# Patient Record
Sex: Female | Born: 1965
Health system: Southern US, Community
[De-identification: ages and names within clinical notes are randomized; demographics above are authoritative.]

## PROBLEM LIST (undated history)

## (undated) DIAGNOSIS — K219 Gastro-esophageal reflux disease without esophagitis: Secondary | ICD-10-CM

## (undated) DIAGNOSIS — F32A Depression, unspecified: Secondary | ICD-10-CM

## (undated) DIAGNOSIS — E669 Obesity, unspecified: Secondary | ICD-10-CM

## (undated) DIAGNOSIS — R1013 Epigastric pain: Secondary | ICD-10-CM

## (undated) DIAGNOSIS — E039 Hypothyroidism, unspecified: Secondary | ICD-10-CM

## (undated) DIAGNOSIS — O24419 Gestational diabetes mellitus in pregnancy, unspecified control: Secondary | ICD-10-CM

## (undated) DIAGNOSIS — M722 Plantar fascial fibromatosis: Secondary | ICD-10-CM

## (undated) DIAGNOSIS — E119 Type 2 diabetes mellitus without complications: Secondary | ICD-10-CM

## (undated) DIAGNOSIS — F419 Anxiety disorder, unspecified: Secondary | ICD-10-CM

## (undated) DIAGNOSIS — E785 Hyperlipidemia, unspecified: Secondary | ICD-10-CM

## (undated) DIAGNOSIS — F329 Major depressive disorder, single episode, unspecified: Secondary | ICD-10-CM

## (undated) DIAGNOSIS — J309 Allergic rhinitis, unspecified: Secondary | ICD-10-CM

## (undated) HISTORY — PX: TONSILLECTOMY AND ADENOIDECTOMY: SUR1326

## (undated) HISTORY — DX: Obesity, unspecified: E66.9

## (undated) HISTORY — DX: Allergic rhinitis, unspecified: J30.9

## (undated) HISTORY — DX: Major depressive disorder, single episode, unspecified: F32.9

## (undated) HISTORY — DX: Hyperlipidemia, unspecified: E78.5

## (undated) HISTORY — DX: Plantar fascial fibromatosis: M72.2

## (undated) HISTORY — DX: Anxiety disorder, unspecified: F41.9

## (undated) HISTORY — PX: OTHER SURGICAL HISTORY: SHX169

## (undated) HISTORY — DX: Hypothyroidism, unspecified: E03.9

## (undated) HISTORY — PX: TUBAL LIGATION: SHX77

## (undated) HISTORY — DX: Type 2 diabetes mellitus without complications: E11.9

## (undated) HISTORY — DX: Depression, unspecified: F32.A

## (undated) HISTORY — DX: Epigastric pain: R10.13

## (undated) HISTORY — DX: Gastro-esophageal reflux disease without esophagitis: K21.9

## (undated) HISTORY — DX: Gestational diabetes mellitus in pregnancy, unspecified control: O24.419

---

## 1999-07-23 ENCOUNTER — Other Ambulatory Visit: Admission: RE | Admit: 1999-07-23 | Discharge: 1999-07-23 | Payer: Self-pay | Admitting: Obstetrics and Gynecology

## 1999-09-10 ENCOUNTER — Encounter: Admission: RE | Admit: 1999-09-10 | Discharge: 1999-12-09 | Payer: Self-pay | Admitting: *Deleted

## 2000-08-04 ENCOUNTER — Other Ambulatory Visit: Admission: RE | Admit: 2000-08-04 | Discharge: 2000-08-04 | Payer: Self-pay | Admitting: Obstetrics and Gynecology

## 2000-09-07 ENCOUNTER — Encounter (INDEPENDENT_AMBULATORY_CARE_PROVIDER_SITE_OTHER): Payer: Self-pay | Admitting: Specialist

## 2000-09-07 ENCOUNTER — Other Ambulatory Visit: Admission: RE | Admit: 2000-09-07 | Discharge: 2000-09-07 | Payer: Self-pay | Admitting: Physical Therapy

## 2000-12-14 ENCOUNTER — Other Ambulatory Visit: Admission: RE | Admit: 2000-12-14 | Discharge: 2000-12-14 | Payer: Self-pay | Admitting: Obstetrics and Gynecology

## 2001-08-17 ENCOUNTER — Other Ambulatory Visit: Admission: RE | Admit: 2001-08-17 | Discharge: 2001-08-17 | Payer: Self-pay | Admitting: Obstetrics and Gynecology

## 2002-02-01 ENCOUNTER — Other Ambulatory Visit: Admission: RE | Admit: 2002-02-01 | Discharge: 2002-02-01 | Payer: Self-pay | Admitting: Obstetrics and Gynecology

## 2002-03-03 ENCOUNTER — Encounter (INDEPENDENT_AMBULATORY_CARE_PROVIDER_SITE_OTHER): Payer: Self-pay | Admitting: *Deleted

## 2002-03-03 ENCOUNTER — Ambulatory Visit (HOSPITAL_COMMUNITY): Admission: RE | Admit: 2002-03-03 | Discharge: 2002-03-03 | Payer: Self-pay | Admitting: Obstetrics and Gynecology

## 2002-09-26 ENCOUNTER — Other Ambulatory Visit: Admission: RE | Admit: 2002-09-26 | Discharge: 2002-09-26 | Payer: Self-pay | Admitting: Obstetrics and Gynecology

## 2003-03-27 ENCOUNTER — Other Ambulatory Visit: Admission: RE | Admit: 2003-03-27 | Discharge: 2003-03-27 | Payer: Self-pay | Admitting: Obstetrics and Gynecology

## 2004-04-23 ENCOUNTER — Other Ambulatory Visit: Admission: RE | Admit: 2004-04-23 | Discharge: 2004-04-23 | Payer: Self-pay | Admitting: Obstetrics and Gynecology

## 2004-11-18 ENCOUNTER — Encounter: Admission: RE | Admit: 2004-11-18 | Discharge: 2005-02-16 | Payer: Self-pay | Admitting: Internal Medicine

## 2005-04-27 ENCOUNTER — Other Ambulatory Visit: Admission: RE | Admit: 2005-04-27 | Discharge: 2005-04-27 | Payer: Self-pay | Admitting: Physical Therapy

## 2005-05-01 ENCOUNTER — Emergency Department (HOSPITAL_COMMUNITY): Admission: EM | Admit: 2005-05-01 | Discharge: 2005-05-01 | Payer: Self-pay | Admitting: Family Medicine
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
## 2007-05-06 ENCOUNTER — Ambulatory Visit (HOSPITAL_COMMUNITY): Admission: RE | Admit: 2007-05-06 | Discharge: 2007-05-06 | Payer: Self-pay | Admitting: Obstetrics and Gynecology

## 2008-07-25 ENCOUNTER — Encounter: Admission: RE | Admit: 2008-07-25 | Discharge: 2008-07-25 | Payer: Self-pay | Admitting: Obstetrics and Gynecology

## 2009-03-12 ENCOUNTER — Encounter: Payer: Self-pay | Admitting: Cardiovascular Disease

## 2009-03-27 ENCOUNTER — Encounter: Payer: Self-pay | Admitting: Cardiovascular Disease

## 2009-03-27 DIAGNOSIS — R079 Chest pain, unspecified: Secondary | ICD-10-CM

## 2009-03-28 ENCOUNTER — Ambulatory Visit: Payer: Self-pay | Admitting: Cardiovascular Disease

## 2009-03-28 DIAGNOSIS — J309 Allergic rhinitis, unspecified: Secondary | ICD-10-CM | POA: Insufficient documentation

## 2009-03-28 DIAGNOSIS — F329 Major depressive disorder, single episode, unspecified: Secondary | ICD-10-CM

## 2009-03-28 DIAGNOSIS — E785 Hyperlipidemia, unspecified: Secondary | ICD-10-CM

## 2009-03-28 DIAGNOSIS — K219 Gastro-esophageal reflux disease without esophagitis: Secondary | ICD-10-CM

## 2009-03-28 DIAGNOSIS — E119 Type 2 diabetes mellitus without complications: Secondary | ICD-10-CM

## 2009-03-28 DIAGNOSIS — E039 Hypothyroidism, unspecified: Secondary | ICD-10-CM | POA: Insufficient documentation

## 2009-04-09 ENCOUNTER — Telehealth (INDEPENDENT_AMBULATORY_CARE_PROVIDER_SITE_OTHER): Payer: Self-pay | Admitting: *Deleted

## 2009-04-10 ENCOUNTER — Encounter (HOSPITAL_COMMUNITY): Admission: RE | Admit: 2009-04-10 | Discharge: 2009-05-23 | Payer: Self-pay | Admitting: Cardiovascular Disease

## 2009-04-10 ENCOUNTER — Ambulatory Visit: Payer: Self-pay | Admitting: Cardiology

## 2009-04-10 ENCOUNTER — Ambulatory Visit: Payer: Self-pay

## 2010-06-15 ENCOUNTER — Encounter: Payer: Self-pay | Admitting: Obstetrics and Gynecology

## 2010-06-16 IMAGING — MG MM DIAGNOSTIC UNILATERAL R
1 series · 1 of 1 positions shown · non-contrast
Comparison: none

CLINICAL DATA: The patient reported crusty material on her right
nipple and in her bra intermittently for 6 months.

DIGITAL DIAGNOSTIC RIGHT MAMMOGRAM

[R CC]
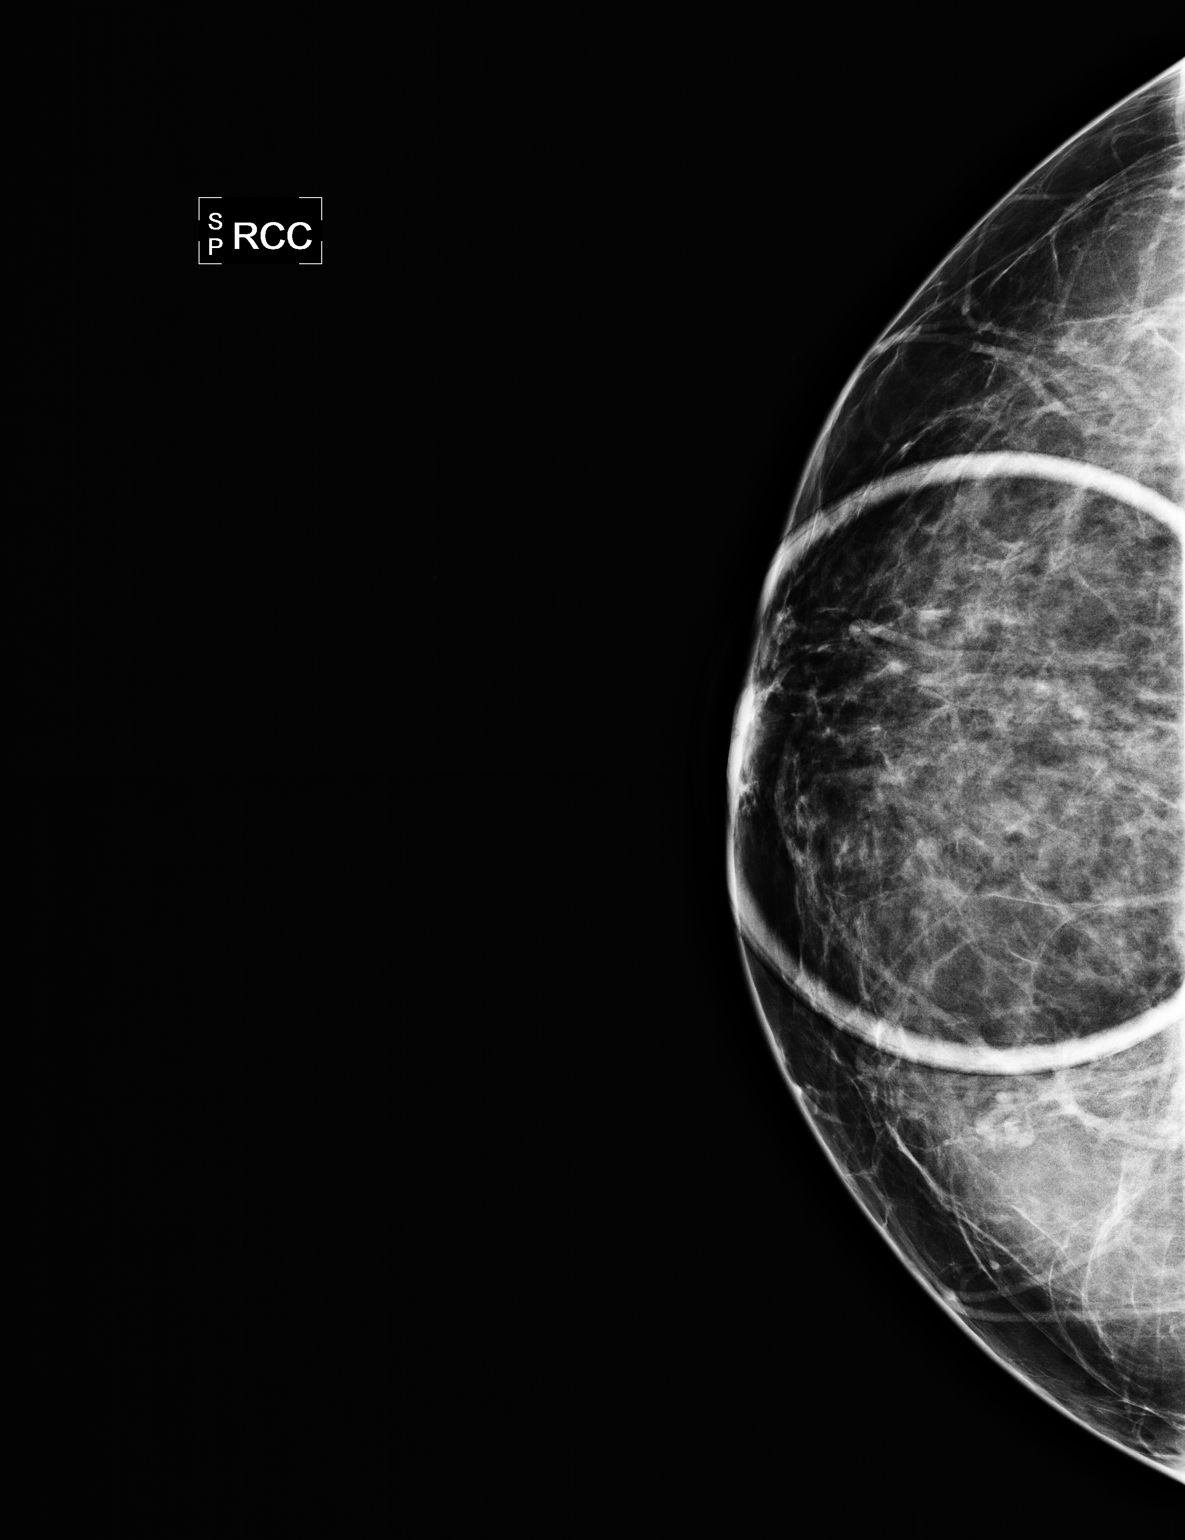

[1 of 1 positions shown; findings below may reference images not displayed]

FINDINGS: A spot tangential view of the right subareolar  region
demonstrates no mass, distortion or calcification to suggest
malignancy.

On physical examination, no discharge is elicited from the right
nipple.  The patient was counseled that a discharge of concern is
one that is unilateral, spontaneous, from a single duct, a
significant amount and clear, serous or bright red.
IMPRESSION: No mammographic evidence of malignancy.  There is no worrisome type
discharge present today.  Yearly screening mammography is
suggested.

BI-RADS CATEGORY 1:  Negative.

## 2010-10-07 NOTE — Op Note (Signed)
NAME:  MONAYE, BLACKIE              ACCOUNT NO.:  [id number]   MEDICAL RECORD NO.:  [id number]          PATIENT TYPE:  AMB   LOCATION:  [facility]                           FACILITY:  [facility]   PHYSICIAN:  Dineen Kid. Rana Snare, M.D.    DATE OF BIRTH:  1965-09-17   DATE OF PROCEDURE:  05/06/2007  DATE OF DISCHARGE:                               OPERATIVE REPORT   PREOPERATIVE DIAGNOSIS:  Multiparity, desires sterility and intrauterine  device.   POSTOPERATIVE DIAGNOSIS:  Multiparity, desires sterility and  intrauterine device.   PROCEDURES:  Laparoscopic bilateral tubal ligation by fulguration and  Mirena IUD  removal.   SURGEON:  Dr. Candice Camp.   ANESTHESIA:  General endotracheal.   INDICATIONS:  Ms. Hamada is a 45 year old, multigravida currently with an  IUD which has exhausted the at 5-year limit. She desires removal of the  IUD and permanent sterility.  Planned laparoscopic bilateral tubal  ligation.  The risks and benefits of the procedure were discussed at  length.  Informed consent was obtained. The risks include but not  limited to risk of infection, bleeding, damage to the uterus, tubes,  ovary, bowel, bladder, risks of tubal failure quoted at 09/998. She  gives her informed consent and wishes to proceed.   FINDINGS AT SURGERY:  A normal-appearing liver, normal-appearing uterus  and bilateral fallopian tubes and ovaries.   DESCRIPTION OF PROCEDURE:  After adequate analgesia, the patient was  placed in the dorsal lithotomy position.  She is sterilely prepped and  draped, the bladder is sterilely drained.  A Graves speculum is placed,  tenaculum is placed in the anterior lip of the cervix.  The IUD string  was easily grasped, delivered through the cervix, appeared to be normal.  The  IUD was then discarded. A Cohen tenaculum was placed on the cervix.  A 1-cm infraumbilical skin incision was made, a Veress needle was  inserted.  The abdomen was insufflated with dullness to percussion.  An  11-mm trocar was inserted.  The above findings were noted by  laparoscope. The right fallopian tube was identified by the fimbriated  end, mid portion of the fallopian tube grasped with bipolar cautery.  It  was cauterized over the mid portion of the fallopian tube approximately  a 3-cm section but good thermal burn noted and loss resistance by the  ohmmeter. The left fallopian tube was identified by the fimbriated end,  mid portion of fallopian tube grasped.  Cauterization with bipolar  cautery over a 3 cm section of fallopian tube with good thermal burn  noted and loss of resistance by the ohmmeter.  After reexamination of  the abdomen and pelvis, no pathology was noted.  The abdomen was then  desufflated, trocars removed.  The infraumbilical skin incision was  closed with a #0 Vicryl interrupted suture in the fascia, 3-0 Rapide  Vicryl in a subcuticular fashion.  The incision was injected with 0.25%  Marcaine, a total of 10 mL used.  The tenaculum was removed from the  cervix noted to be hemostatic.  The patient was then transferred to the  recovery room  in stable condition.  Sponge and instrument count was  normal x3.  Estimated blood loss was minimal.  The patient received 1  gram of cefotetan preoperatively and 30 mg of Toradol postoperatively.   DISPOSITION:  The patient will be discharged home and will follow-up in  the office in 2-3 weeks. Sent home with an instruction sheet for a  laparoscopic tubal. Told to returned for increased pain, fever or  bleeding. She also has a prescription for Vicodin #20.      Dineen Kid Rana Snare, M.D.  Electronically Signed     DCL/MEDQ  D:  05/06/2007  T:  05/06/2007  Job:  [id number]

## 2010-10-10 NOTE — H&P (Signed)
   NAME:  Doris Schmidt, Doris Schmidt                        ACCOUNT NO.:  [id number]   MEDICAL RECORD NO.:  [id number]                   PATIENT TYPE:  AMB   LOCATION:  [facility]                                  [facility]ILITY:  [facility]   PHYSICIAN:  Dineen Kid. Rana Snare, M.D.                 DATE OF BIRTH:  March 17, 1966   DATE OF ADMISSION:  03/03/2002  DATE OF DISCHARGE:                                HISTORY & PHYSICAL   HISTORY OF PRESENT ILLNESS:  The patient is a 45 year old, G2, P2 with  abnormal uterine bleeding with breakthrough bleeding despite being on oral  contraceptives agents.  She underwent a sonohistogram which shows a small  endometrial echogenic area consistent with a polyp which measured 3.8 mm in  size.  She also desires IUD and has a history of dysmenorrhea and is  currently on Ortho-Novum 135.  She presents for hysteroscopy, D&C and IUD  insertion.   PAST MEDICAL HISTORY:  Negative.   PAST SURGICAL HISTORY:  Tonsillectomy at age 5.   MEDICATIONS:  Ortho-Novum birth control pills.   ALLERGIES:  SULFA.  SHELLFISH.   PHYSICAL EXAMINATION:  VITAL SIGNS:  Blood pressure 92/58.  HEART:  Regular rate and rhythm.  LUNGS:  Clear to auscultation bilaterally.  ABDOMEN:  Nondistended, nontender.  PELVIC:  Uterus is anteverted, mobile and nontender.   LABORATORY DATA AND X-RAY FINDINGS:  Ultrasound shows a 3.8 mm calcified  endometrial mass in the posterior endometrium.   IMPRESSION:  1. Abnormal uterine bleeding the responds to conservative medical     management.  2. Endometrial mass on the histogram.   PLAN:  1. Hysteroscopy and D&C with treatment of evaluation of this.  The risks and     benefits were discussed and informed consent was obtained.  2. Regarding the IUD, she is set on this and the risks and benefits were     discussed at length and she signed an informed consent for the IUD.                                               Dineen Kid Rana Snare, M.D.    DCL/MEDQ  D:  03/03/2002  T:   03/03/2002  Job:  [id number]

## 2010-10-10 NOTE — Op Note (Signed)
NAME:  Doris Schmidt, Doris Schmidt                        ACCOUNT NO.:  [id number]   MEDICAL RECORD NO.:  [id number]                   PATIENT TYPE:  AMB   LOCATION:  [facility]                                  [facility]   PHYSICIAN:  Dineen Kid. Rana Snare, M.D.                 DATE OF BIRTH:  [birth year]   DATE OF PROCEDURE:  03/03/2002  DATE OF DISCHARGE:                                 OPERATIVE REPORT   PREOPERATIVE DIAGNOSES:  1. Abnormal uterine bleeding.  2. Endometrial masses on hysterogram.  3. Desires Marina intrauterine device.   POSTOPERATIVE DIAGNOSES:  1. Abnormal uterine bleeding.  2. Endometrial masses on hysterogram.  3. Desires Marina intrauterine device.  4. Endometrial polyp.   PROCEDURE:  Hysteroscopy D & C with polypectomy and IUD insertion.   SURGEON:  Dineen Kid. Rana Snare, M.D.   ANESTHESIA:  Monitored anesthetic care and paracervical block.   INDICATIONS FOR PROCEDURE:  This patient is a 45 year old G2, P2, with  abnormal uterine bleeding on birth control pills.  She underwent a  sonohysterogram which showed an endometrial mass, consistent with  endometrial polyps.  She presents for hysteroscopy D & C.  Also desires  Jearld Adjutant IUD insertion.  The risks and benefits were discussed at length.  An  informed consent was obtained.  Findings at the time of surgery were a small  thickening on the posterior endometrium, consistent with an early  endometrial polyp; otherwise, normal-appearing endometrium, ostium, and  cervix.   DESCRIPTION OF PROCEDURE:  After adequate analgesia, the patient was placed  in the dorsal lithotomy position.  She was sterilely prepped and draped.  The bladder was thoroughly drained.  A Graves speculum was placed.  A  tenaculum was placed on the anterior lip of the cervix.  A paracervical  block was placed with 1% Xylocaine with 1:100,000 epinephrine, 20 cc used.  The uterus was sounded to 8 cm and easily dilated to a #27 Pratt dilator.  The hysteroscope was  inserted with the above findings noted.  A polypectomy  was carried out with polyp forceps.  It was followed by gentle sharp  curettage, retrieving small fragments of endometrium.  Re-examination with  the hysteroscope revealed normal-appearing endometrium with no residual  endometrial polyp.  At this point, the [facility] IUD was inserted according to  the [employer] specifications. The string was cut to 0.5 cm.  The  tenaculum was removed from the anterior lip of the cervix.  Hemostasis was  achieved with direct pressure, and then the speculum was then removed.  The  patient tolerated the procedure well and was stable and transferred to the  recovery room.  The patient received 1 g of Cefotetan preoperatively and  Toradol 30 mg intraoperatively.    DISPOSITION:  The patient will be discharged home with followup in the  office in two to three weeks.  She was sent home with a routine instruction  sheet for D & C.                                               Dineen Kid Rana Snare, M.D.    DCL/MEDQ  D:  03/03/2002  T:  03/04/2002  Job:  [id number]

## 2011-03-02 LAB — COMPREHENSIVE METABOLIC PANEL
ALT: 20
AST: 17
Albumin: 4
CO2: 28
Calcium: 9.2
GFR calc Af Amer: >60
Sodium: 139
Total Protein: 6.8

## 2011-03-02 LAB — CBC
MCHC: 34.9
Platelets: 283
RBC: 4.45
RDW: 12.2

## 2011-03-02 LAB — URINALYSIS, ROUTINE W REFLEX MICROSCOPIC
Glucose, UA: NEGATIVE
Leukocytes, UA: NEGATIVE
Protein, ur: NEGATIVE
Urobilinogen, UA: 0.2
pH: 7

## 2011-03-02 LAB — URINE MICROSCOPIC-ADD ON

## 2012-03-24 ENCOUNTER — Other Ambulatory Visit: Payer: Self-pay | Admitting: Obstetrics and Gynecology

## 2014-07-23 ENCOUNTER — Other Ambulatory Visit: Payer: Self-pay | Admitting: Obstetrics and Gynecology

## 2014-07-24 LAB — CYTOLOGY - PAP

## 2016-06-02 DIAGNOSIS — E1165 Type 2 diabetes mellitus with hyperglycemia: Secondary | ICD-10-CM | POA: Diagnosis not present

## 2016-06-02 DIAGNOSIS — E038 Other specified hypothyroidism: Secondary | ICD-10-CM | POA: Diagnosis not present

## 2016-06-03 ENCOUNTER — Other Ambulatory Visit (HOSPITAL_COMMUNITY): Payer: Self-pay | Admitting: Emergency Medicine

## 2016-06-03 DIAGNOSIS — R52 Pain, unspecified: Secondary | ICD-10-CM

## 2016-06-08 DIAGNOSIS — E1165 Type 2 diabetes mellitus with hyperglycemia: Secondary | ICD-10-CM | POA: Diagnosis not present

## 2016-06-08 DIAGNOSIS — Z0000 Encounter for general adult medical examination without abnormal findings: Secondary | ICD-10-CM | POA: Diagnosis not present

## 2016-06-08 DIAGNOSIS — Z1389 Encounter for screening for other disorder: Secondary | ICD-10-CM | POA: Diagnosis not present

## 2016-06-12 ENCOUNTER — Ambulatory Visit (HOSPITAL_COMMUNITY)
Admission: RE | Admit: 2016-06-12 | Discharge: 2016-06-12 | Disposition: A | Discharge status: Home / Self Care | Payer: 59 | Source: Ambulatory Visit | Attending: Emergency Medicine | Admitting: Emergency Medicine

## 2016-06-12 DIAGNOSIS — R52 Pain, unspecified: Secondary | ICD-10-CM

## 2016-09-04 ENCOUNTER — Other Ambulatory Visit (HOSPITAL_COMMUNITY): Payer: Self-pay

## 2016-09-04 DIAGNOSIS — R1031 Right lower quadrant pain: Secondary | ICD-10-CM

## 2016-09-04 DIAGNOSIS — R1032 Left lower quadrant pain: Principal | ICD-10-CM

## 2016-09-08 ENCOUNTER — Ambulatory Visit (HOSPITAL_COMMUNITY)
Admission: RE | Admit: 2016-09-08 | Discharge: 2016-09-08 | Disposition: A | Discharge status: Home / Self Care | Payer: Self-pay | Source: Ambulatory Visit

## 2016-09-08 DIAGNOSIS — R1031 Right lower quadrant pain: Secondary | ICD-10-CM | POA: Insufficient documentation

## 2016-09-08 DIAGNOSIS — R1032 Left lower quadrant pain: Secondary | ICD-10-CM | POA: Insufficient documentation

## 2016-09-14 DIAGNOSIS — E1165 Type 2 diabetes mellitus with hyperglycemia: Secondary | ICD-10-CM | POA: Diagnosis not present

## 2016-09-16 DIAGNOSIS — Z01419 Encounter for gynecological examination (general) (routine) without abnormal findings: Secondary | ICD-10-CM | POA: Diagnosis not present

## 2016-12-08 DIAGNOSIS — E1165 Type 2 diabetes mellitus with hyperglycemia: Secondary | ICD-10-CM | POA: Diagnosis not present

## 2016-12-08 DIAGNOSIS — E038 Other specified hypothyroidism: Secondary | ICD-10-CM | POA: Diagnosis not present

## 2017-01-07 ENCOUNTER — Ambulatory Visit (INDEPENDENT_AMBULATORY_CARE_PROVIDER_SITE_OTHER): Payer: 59 | Admitting: Neurology

## 2017-01-07 ENCOUNTER — Encounter: Payer: Self-pay | Admitting: Neurology

## 2017-01-07 VITALS — BP 125/78 | HR 72 | Ht 66.0 in | Wt 251.0 lb

## 2017-01-07 DIAGNOSIS — R51 Headache: Secondary | ICD-10-CM

## 2017-01-07 DIAGNOSIS — R0683 Snoring: Secondary | ICD-10-CM

## 2017-01-07 DIAGNOSIS — G4761 Periodic limb movement disorder: Secondary | ICD-10-CM | POA: Diagnosis not present

## 2017-01-07 DIAGNOSIS — Z6841 Body Mass Index (BMI) 40.0 and over, adult: Secondary | ICD-10-CM | POA: Diagnosis not present

## 2017-01-07 DIAGNOSIS — G4719 Other hypersomnia: Secondary | ICD-10-CM | POA: Diagnosis not present

## 2017-01-07 DIAGNOSIS — G2581 Restless legs syndrome: Secondary | ICD-10-CM | POA: Diagnosis not present

## 2017-01-07 DIAGNOSIS — R519 Headache, unspecified: Secondary | ICD-10-CM

## 2017-01-07 NOTE — Progress Notes (Signed)
Subjective:    Patient ID: Doris Schmidt is a 51 y.o. female.  HPI     Doris Foley, MD, PhD [facility] [address] [address] [place], [place] [address]  Dear Dr. Timothy Schmidt,   I saw your patient, Doris Schmidt, upon your kind request in my neurologic clinic today for initial consultation of her sleep disorder, in particular, concern for underlying obstructive sleep apnea. The patient is unaccompanied today. As you know, Doris Schmidt is a 51 year old right-handed woman with an underlying medical history of type 2 diabetes, hypothyroidism, reflux disease, depression, hyperlipidemia, allergic rhinitis, plantar fasciitis,  remote history of smoking and morbid obesity with a BMI of over 40, who reports snoring and excessive daytime somnolence. I reviewed your office note from 12/08/2016, which kindly included. She had a sleep study many years ago which was reportedly negative for sleep apnea. Prior sleep study results are not available for my review today.  She believes her sleep study was about 10 years ago or so. Her snoring has become worse in the past few years, in the past several months her daytime somnolence has become worse. Her Epworth sleepiness score is 14 or 15 out of 24, fatigue score is 35 out of 63. She does not wake up rested, she has multiple nighttime awakenings. She does not always allow for 7 or 8 hours of sleep. Bedtime is around 11 PM, wakeup time around 6. She does have a somewhat flexible work schedule at this time. She is also transitioning into a new overall at her workplace. She has 2 children, ages 35 and 40. She quit smoking in 1998, drinks alcohol infrequently, about twice a month or so, one large cup of coffee per day typically and no day-to-day sodas or tea. She had more stress in the recent past. Last August her father was diagnosed with leukemia. He is doing better thankfully. Her weight has remained fairly stable, she did have some weight  loss but gained it back for the most part. She endorses restless leg symptoms and has been told by her husband that she moves a lot in her sleep including rubbing her feet. She does not typically have night to night nocturia but does have occasional morning headaches. She is not aware of any family history of OSA but does remember that her father snores loudly.  Her Past Medical History Is Significant For: Past Medical History:  Diagnosis Date  . Allergic rhinitis   . Anxiety   . Depression   . Diabetes mellitus without complication (HCC)   . Dyspepsia   . GERD (gastroesophageal reflux disease)   . Gestational diabetes   . Hyperlipidemia   . Hypothyroidism   . Obesity   . Plantar fasciitis of right foot     Her Past Surgical History Is Significant For: Past Surgical History:  Procedure Laterality Date  . TONSILLECTOMY AND ADENOIDECTOMY    . TUBAL LIGATION    . tubes in ears      Her Family History Is Significant For: Family History  Problem Relation Age of Onset  . Hypertension Mother   . Hyperlipidemia Mother   . Diabetes Mellitus II Father   . CAD Father   . Leukemia Father     Her Social History Is Significant For: Social History   Social History  . Marital status: Married    Spouse name: N/A  . Number of children: N/A  . Years of education: N/A   Social History Main  Topics  . Smoking status: Former Smoker    Quit date: 05/25/1996  . Smokeless tobacco: Never Used  . Alcohol use Yes  . Drug use: No  . Sexual activity: Not Asked   Other Topics Concern  . None   Social History Narrative  . None    Her Allergies Are:  No Known Allergies:   Her Current Medications Are:  Outpatient Encounter Prescriptions as of 01/07/2017  Medication Sig  . aspirin EC 81 MG tablet Take 81 mg by mouth daily.  Marland Kitchen buPROPion (WELLBUTRIN XL) 300 MG 24 hr tablet Take 300 mg by mouth daily.  . citalopram (CELEXA) 20 MG tablet Take 20 mg by mouth daily.  . fluticasone (FLONASE)  50 MCG/ACT nasal spray Place 2 sprays into both nostrils daily.  Marland Kitchen glimepiride (AMARYL) 2 MG tablet Take 2 mg by mouth every evening.  Marland Kitchen glimepiride (AMARYL) 4 MG tablet Take 4 mg by mouth daily with breakfast.   . levothyroxine (SYNTHROID, LEVOTHROID) 125 MCG tablet Take 125 mcg by mouth daily before breakfast.  . loratadine (CLARITIN) 10 MG tablet Take 10 mg by mouth daily.  . metFORMIN (GLUCOPHAGE) 1000 MG tablet Take 1,000 mg by mouth 2 (two) times daily with a meal.   No facility-administered encounter medications on file as of 01/07/2017.   :  Review of Systems:  Out of a complete 14 point review of systems, all are reviewed and negative with the exception of these symptoms as listed below: Review of Systems  Neurological:       Pt presents today to discuss her sleep. Pt has had a sleep study 10 years ago and it was normal. Pt does snore.  Epworth Sleepiness Scale 0= would never doze 1= slight chance of dozing 2= moderate chance of dozing 3= high chance of dozing  Sitting and reading: 3 Watching TV: 3 Sitting inactive in a public place (ex. Theater or meeting): 1 As a passenger in a car for an hour without a break: 3 Lying down to rest in the afternoon: 1 Sitting and talking to someone: 1 Sitting quietly after lunch (no alcohol): 1 In a car, while stopped in traffic: 1-2 Total: 14-15     Objective:  Neurological Exam  Physical Exam Physical Examination:   Vitals:   01/07/17 1615  BP: 125/78  Pulse: 72    General Examination: The patient is a very pleasant 51 y.o. female in no acute distress. She appears well-developed and well-nourished and well groomed.   HEENT: Normocephalic, atraumatic, pupils are equal, round and reactive to light and accommodation. Extraocular tracking is good without limitation to gaze excursion or nystagmus noted. Normal smooth pursuit is noted. Hearing is grossly intact. Face is symmetric with normal facial animation and normal facial  sensation. Speech is clear with no dysarthria noted. There is no hypophonia. There is no lip, neck/head, jaw or voice tremor. Neck is supple with full range of passive and active motion. There are no carotid bruits on auscultation. Oropharynx exam reveals: mild mouth dryness, good dental hygiene and mild airway crowding, due to smaller airway entry, otherwise benign findings, tonsils are absent. Mallampati is class I. Neck circumference is 14-3/4 inches. She has a minimal overbite.  Chest: Clear to auscultation without wheezing, rhonchi or crackles noted.  Heart: S1+S2+0, regular and normal without murmurs, rubs or gallops noted.   Abdomen: Soft, non-tender and non-distended with normal bowel sounds appreciated on auscultation.  Extremities: There is no pitting edema in the distal lower extremities  bilaterally. Pedal pulses are intact.  Skin: Warm and dry without trophic changes noted. There are no varicose veins.  Musculoskeletal: exam reveals no obvious joint deformities, tenderness or joint swelling or erythema.   Neurologically:  Mental status: The patient is awake, alert and oriented in all 4 spheres. Her immediate and remote memory, attention, language skills and fund of knowledge are appropriate. There is no evidence of aphasia, agnosia, apraxia or anomia. Speech is clear with normal prosody and enunciation. Thought process is linear. Mood is normal and affect is normal.  Cranial nerves II - XII are as described above under HEENT exam. In addition: shoulder shrug is normal with equal shoulder height noted. Motor exam: Normal bulk, strength and tone is noted. There is no drift, tremor or rebound. Romberg is negative. Reflexes are 2+ throughout. Fine motor skills and coordination: intact with normal finger taps, normal hand movements, normal rapid alternating patting, normal foot taps and normal foot agility.  Cerebellar testing: No dysmetria or intention tremor on finger to nose testing. Heel  to shin is unremarkable bilaterally. There is no truncal or gait ataxia.  Sensory exam: intact to light touch in the upper and lower extremities.  Gait, station and balance: She stands easily. No veering to one side is noted. No leaning to one side is noted. Posture is age-appropriate and stance is narrow based. Gait shows normal stride length and normal pace. No problems turning are noted. Tandem walk is unremarkable.  Assessment and plan:  In summary, Doris Schmidt is a very pleasant 51 y.o.-year old female with an underlying medical history of type 2 diabetes, hypothyroidism, reflux disease, depression, hyperlipidemia, allergic rhinitis, plantar fasciitis,  remote history of smoking and morbid obesity with a BMI of over 40, whose history and physical exam are concerning for obstructive sleep apnea (OSA). In addition, she endorses sleep disruption which could be secondary to PLMS and she does endorse restless leg symptoms. An attended sleep study is therefore indicated and justified. I had a long chat with the patient about my findings and the diagnosis of OSA, its prognosis and treatment options. We talked about medical treatments, surgical interventions and non-pharmacological approaches. I explained in particular the risks and ramifications of untreated moderate to severe OSA, especially with respect to developing cardiovascular disease down the Road, including congestive heart failure, difficult to treat hypertension, cardiac arrhythmias, or stroke. Even type 2 diabetes has, in part, been linked to untreated OSA. Symptoms of untreated OSA include daytime sleepiness, memory problems, mood irritability and mood disorder such as depression and anxiety, lack of energy, as well as recurrent headaches, especially morning headaches. We talked about trying to maintain a healthy lifestyle in general, as well as the importance of weight control. I encouraged the patient to eat healthy, exercise daily and keep  well hydrated, to keep a scheduled bedtime and wake time routine, to not skip any meals and eat healthy snacks in between meals. I advised the patient not to drive when feeling sleepy. I recommended the following at this time: sleep study with potential positive airway pressure titration. (We will score hypopneas at 4%).   I explained the sleep test procedure to the patient and also outlined possible surgical and non-surgical treatment options of OSA, including the use of a custom-made dental device (which would require a referral to a specialist dentist or oral surgeon), upper airway surgical options, such as pillar implants, radiofrequency surgery, tongue base surgery, and UPPP (which would involve a referral to an ENT  Careers adviser). Rarely, jaw surgery such as mandibular advancement may be considered.  I also explained the CPAP treatment option to the patient, who indicated that she would be willing to try CPAP if the need arises. I explained the importance of being compliant with PAP treatment, not only for insurance purposes but primarily to improve Her symptoms, and for the patient's long term health benefit, including to reduce Her cardiovascular risks. I answered all her questions today and the patient was in agreement. I would like to see her back after the sleep study is completed and encouraged her to call with any interim questions, concerns, problems or updates.   Thank you very much for allowing me to participate in the care of this nice patient. If I can be of any further assistance to you please do not hesitate to call me at [phone].  Sincerely,   Doris Foley, MD, PhD

## 2017-01-07 NOTE — Patient Instructions (Signed)
Based on your symptoms and your exam I believe you are at risk for obstructive sleep apnea or OSA, and I think we should proceed with a sleep study to determine whether you do or do not have OSA and how severe it is. If you have more than mild OSA, I want you to consider treatment with CPAP. Please remember, the risks and ramifications of moderate to severe obstructive sleep apnea or OSA are: Cardiovascular disease, including congestive heart failure, stroke, difficult to control hypertension, arrhythmias, and even type 2 diabetes has been linked to untreated OSA. Sleep apnea causes disruption of sleep and sleep deprivation in most cases, which, in turn, can cause recurrent headaches, problems with memory, mood, concentration, focus, and vigilance. Most people with untreated sleep apnea report excessive daytime sleepiness, which can affect their ability to drive. Please do not drive if you feel sleepy.   I will likely see you back after your sleep study to go over the test results and where to go from there. We will call you after your sleep study to advise about the results (most likely, you will hear from Diana, my nurse) and to set up an appointment at the time, as necessary.    Our sleep lab administrative assistant, Dawn will meet with you or call you to schedule your sleep study. If you don't hear back from her by about 2 weeks from now, please feel free to call her at [phone]. This is her direct line and please leave a message with your phone number to call back if you get the voicemail box. She will call back as soon as possible.   

## 2017-02-08 ENCOUNTER — Telehealth: Payer: Self-pay | Admitting: Neurology

## 2017-02-08 DIAGNOSIS — R0683 Snoring: Secondary | ICD-10-CM

## 2017-02-08 DIAGNOSIS — G4719 Other hypersomnia: Secondary | ICD-10-CM

## 2017-02-08 DIAGNOSIS — Z6841 Body Mass Index (BMI) 40.0 and over, adult: Secondary | ICD-10-CM

## 2017-02-08 NOTE — Telephone Encounter (Signed)
[employer] denied Split sleep study and approved a HST.  Can I get an order for HST? °

## 2017-02-24 ENCOUNTER — Ambulatory Visit (INDEPENDENT_AMBULATORY_CARE_PROVIDER_SITE_OTHER): Payer: 59 | Admitting: Neurology

## 2017-02-24 DIAGNOSIS — G4733 Obstructive sleep apnea (adult) (pediatric): Secondary | ICD-10-CM

## 2017-02-24 DIAGNOSIS — R0683 Snoring: Secondary | ICD-10-CM

## 2017-02-24 DIAGNOSIS — Z6841 Body Mass Index (BMI) 40.0 and over, adult: Secondary | ICD-10-CM

## 2017-02-24 DIAGNOSIS — G4719 Other hypersomnia: Secondary | ICD-10-CM

## 2017-02-25 NOTE — Procedures (Signed)
  [facility] [address] [place], [place] [address] NAME: Doris Schmidt DOB: January 30, 1966 MEDICAL RECORD [id number] DOS: 02/24/17 REFERRING PHYSICIAN: Creola Corn, MD STUDY PERFORMED: Home Sleep Study HISTORY: 51 year old woman with a history of type 2 diabetes, hypothyroidism, reflux disease, depression, hyperlipidemia, allergic rhinitis, plantar fasciitis, and morbid obesity with a BMI of over 40, who reports snoring and excessive daytime somnolence. Her Epworth sleepiness score is 14 or 15 out of 24.    STUDY RESULTS: Total Recording:    8 Hours, 41 Minutes, duration of evaluation time: 8 hours, 29 minutes Total Apnea/Hypopnea Index (AHI):  29.3/Hour Average Oxygen Saturation:     94% Lowest Oxygen Saturation:      84%  Time Oxygen Saturation Below 88%:  2% (8 min) Average Heart Rate:      80 BPM IMPRESSION: OSA RECOMMENDATION:  This home sleep test demonstrates overall moderate/near-severe obstructive sleep apnea with a total AHI of 29.3/hour and O2 nadir of 84%. Given the patient's medical history and sleep related complaints, treatment with positive airway pressure (in the form of CPAP) is recommended. This will require a full night CPAP titration study for proper treatment settings and mask fitting. Please note that untreated obstructive sleep apnea carries additional perioperative morbidity. Patients with significant obstructive sleep apnea should receive perioperative PAP therapy and the surgeons and particularly the anesthesiologist should be informed of the diagnosis and the severity of the sleep disordered breathing. The patient should be cautioned not to drive, work at heights, or operate dangerous or heavy equipment when tired or sleepy. Review and reiteration of good sleep hygiene measures should be pursued with any patient. The patient and his referring provider will be notified of the test results. The patient will be seen in follow up in sleep  clinic at [facility]. I certify that I have reviewed the raw data recording prior to the issuance of this report in accordance with the standards of the American Academy of Sleep Medicine (AASM). [name], MD, PhD [facility]) Diplomat, ABPN (neurology and sleep)

## 2017-02-25 NOTE — Addendum Note (Signed)
Addended by: [name] on: 02/25/2017 06:53 PM   Modules accepted: Orders

## 2017-02-25 NOTE — Progress Notes (Signed)
Patient referred by Dr. Timothy Lasso, seen by me on 01/07/17, HST on 02/24/17:  Please call and notify the patient that the recent home sleep test did suggest the diagnosis of moderate/near-severe obstructive sleep apnea and that I recommend treatment for this in the form of CPAP. I will request an overnight sleep study for proper titration and mask fitting. Please explain to patient and arrange for a CPAP titration study. I have placed an order in the chart. Thanks, and please route to [facility] for scheduling.   [name], MD, PhD [facility])

## 2017-03-01 ENCOUNTER — Telehealth: Payer: Self-pay | Admitting: Neurology

## 2017-03-01 DIAGNOSIS — G4733 Obstructive sleep apnea (adult) (pediatric): Secondary | ICD-10-CM

## 2017-03-01 NOTE — Telephone Encounter (Signed)
[facility] denied CPAP Titration suggest Auto pap

## 2017-03-01 NOTE — Telephone Encounter (Signed)
-----   Message from [name], MD sent at 02/25/2017  6:53 PM EDT ----- Patient referred by Dr. Timothy Lasso, seen by me on 01/07/17, HST on 02/24/17:  Please call and notify the patient that the recent home sleep test did suggest the diagnosis of moderate/near-severe obstructive sleep apnea and that I recommend treatment for this in the form of CPAP. I will request an overnight sleep study for proper titration and mask fitting. Please explain to patient and arrange for a CPAP titration study. I have placed an order in the chart. Thanks, and please route to [facility] for scheduling.   [name], MD, PhD [facility])

## 2017-03-01 NOTE — Telephone Encounter (Signed)
Order for auto pap placed, per Dr. Vickey Huger request. I will pt to discuss later.

## 2017-03-01 NOTE — Telephone Encounter (Signed)
I called pt. I advised pt that Dr. Frances Furbish reviewed their sleep study results and found that pt pt has moderate to near severe osa. Dr. Frances Furbish recommends that pt return to a cpap titration study, but pt's insurance denied this study. In Dr. Teofilo Pod absence this week, Dr. Vickey Huger ordered an auto pap. I reviewed PAP compliance expectations with the pt. Pt is agreeable to starting an auto-PAP. I advised pt that an order will be sent to a DME, [facility], and [facility] will call the pt within about one week after they file with the pt's insurance. [facility] will show the pt how to use the machine, fit for masks, and troubleshoot the auto-PAP if needed. A follow up appt was made for insurance purposes with Dr. Frances Furbish on Wednesday, December 12th, 2018 at 8:30AM. Pt verbalized understanding to arrive 15 minutes early and bring their auto-PAP. A letter with all of this information in it will be mailed to the pt as a reminder. I verified with the pt that the address we have on file is correct. Pt verbalized understanding of results. Pt had no questions at this time but was encouraged to call back if questions arise.

## 2017-03-15 DIAGNOSIS — G4733 Obstructive sleep apnea (adult) (pediatric): Secondary | ICD-10-CM | POA: Diagnosis not present

## 2017-03-16 DIAGNOSIS — E1165 Type 2 diabetes mellitus with hyperglycemia: Secondary | ICD-10-CM | POA: Diagnosis not present

## 2017-03-29 DIAGNOSIS — G4733 Obstructive sleep apnea (adult) (pediatric): Secondary | ICD-10-CM | POA: Diagnosis not present

## 2017-04-15 DIAGNOSIS — G4733 Obstructive sleep apnea (adult) (pediatric): Secondary | ICD-10-CM | POA: Diagnosis not present

## 2017-05-04 ENCOUNTER — Telehealth: Payer: Self-pay

## 2017-05-04 NOTE — Telephone Encounter (Signed)
I called pt, advised her that our office will be closed tomorrow until 10:30am. Pt is agreeable to coming in on 05/10/17 at 8:00am instead. Pt verbalized understanding of new appt date and time.

## 2017-05-05 ENCOUNTER — Encounter: Payer: Self-pay | Admitting: Neurology

## 2017-05-05 ENCOUNTER — Ambulatory Visit: Payer: Self-pay | Admitting: Neurology

## 2017-05-06 NOTE — Telephone Encounter (Signed)
I called pt, offered pt an appt with Dr. Frances FurbishAthar on 05/10/17 at 9:30am instead of 8:00 appt. Pt is agreeable to a 9:30am appt on 05/10/17. Pt verbalized understanding of new appt date and time.

## 2017-05-10 ENCOUNTER — Ambulatory Visit (INDEPENDENT_AMBULATORY_CARE_PROVIDER_SITE_OTHER): Payer: 59 | Admitting: Neurology

## 2017-05-10 ENCOUNTER — Encounter: Payer: Self-pay | Admitting: Neurology

## 2017-05-10 VITALS — BP 140/82 | HR 79 | Ht 66.0 in | Wt 267.0 lb

## 2017-05-10 DIAGNOSIS — G4733 Obstructive sleep apnea (adult) (pediatric): Secondary | ICD-10-CM | POA: Diagnosis not present

## 2017-05-10 DIAGNOSIS — R202 Paresthesia of skin: Secondary | ICD-10-CM | POA: Diagnosis not present

## 2017-05-10 DIAGNOSIS — Z9989 Dependence on other enabling machines and devices: Secondary | ICD-10-CM

## 2017-05-10 NOTE — Progress Notes (Signed)
Subjective:    Patient ID: Doris Schmidt is a 51 y.o. female.  HPI     Interim history:   Doris Schmidt is a 51 year old right-handed woman with an underlying medical history of type 2 diabetes, hypothyroidism, reflux disease, depression, hyperlipidemia, allergic rhinitis, plantar fasciitis,  remote history of smoking and morbid obesity with a BMI of over 4, who presents for follow-up consultation of her obstructive sleep apnea, after recent home sleep testing and starting AutoPap therapy. The patient is unaccompanied today. I first met her on 02/08/2017 at the request of her primary care physician, at which time she reported snoring and daytime somnolence and a negative sleep study over 10 years ago. I suggested we bring her in for sleep study. Her insurance denied and attended sleep study. She had a home sleep test on 02/24/2017 which indicated moderate obstructive sleep apnea, AHI of 29.3 per hour, average oxygen saturation of 94%, nadir of 84%, time below or at 88% saturation of 8 minutes. Her insurance denied and attended CPAP titration study and she was advised to start AutoPap therapy at home.  Today, 05/10/2017: I reviewed her AutoPap compliance data from 04/06/2017 through 05/05/2017 which is a total of 30 days, during which time she used her AutoPap every night with percent used days greater than 4 hours at 100%, indicating superb compliance with an average usage of 7 hours and 22 minutes, residual AHI at goal at 1.3 per hour, 95th percentile of pressure at 12.3 cm, leak on the high side with the 95th percentile at 25 L/m on a pressure range of 5-15 cm with EPR. She reports feeling better. She indicates waking up better rested. Has has a few issues with congestion. She switched from a mask to nasal pillows which helped, had initial issues with nostril soreness.   The patient's allergies, current medications, family history, past medical history, past social history, past surgical history and  problem list were reviewed and updated as appropriate.   Previously (copied from previous notes for reference):   02/08/2017: (She) reports snoring and excessive daytime somnolence. I reviewed your office note from 12/08/2016, which kindly included. She had a sleep study many years ago which was reportedly negative for sleep apnea. Prior sleep study results are not available for my review today.  She believes her sleep study was about 10 years ago or so. Her snoring has become worse in the past few years, in the past several months her daytime somnolence has become worse. Her Epworth sleepiness score is 14 or 15 out of 24, fatigue score is 35 out of 63. She does not wake up rested, she has multiple nighttime awakenings. She does not always allow for 7 or 8 hours of sleep. Bedtime is around 11 PM, wakeup time around 6. She does have a somewhat flexible work schedule at this time. She is also transitioning into a new overall at her workplace. She has 2 children, ages 48 and 44. She quit smoking in 1998, drinks alcohol infrequently, about twice a month or so, one large cup of coffee per day typically and no day-to-day sodas or tea. She had more stress in the recent past. Last August her father was diagnosed with leukemia. He is doing better thankfully. Her weight has remained fairly stable, she did have some weight loss but gained it back for the most part. She endorses restless leg symptoms and has been told by her husband that she moves a lot in her sleep including rubbing her feet.  She does not typically have night to night nocturia but does have occasional morning headaches. She is not aware of any family history of OSA but does remember that her father snores loudly.  Her Past Medical History Is Significant For: Past Medical History:  Diagnosis Date  . Allergic rhinitis   . Anxiety   . Depression   . Diabetes mellitus without complication ([place])   . Dyspepsia   . GERD (gastroesophageal reflux  disease)   . Gestational diabetes   . Hyperlipidemia   . Hypothyroidism   . Obesity   . Plantar fasciitis of right foot     Her Past Surgical History Is Significant For:  Her Family History Is Significant For: Family History  Problem Relation Age of Onset  . Hypertension Mother   . Hyperlipidemia Mother   . Diabetes Mellitus II Father   . CAD Father   . Leukemia Father     Her Social History Is Significant For: Social History   Socioeconomic History  . Marital status: Married    Spouse name: None  . Number of children: None  . Years of education: None  . Highest education level: None  Social Needs  . Financial resource strain: None  . Food insecurity - worry: None  . Food insecurity - inability: None  . Transportation needs - medical: None  . Transportation needs - non-medical: None  Occupational History  . None  Tobacco Use  . Smoking status: Former Smoker    Last attempt to quit: 05/25/1996    Years since quitting: 20.9  . Smokeless tobacco: Never Used  Substance and Sexual Activity  . Alcohol use: Yes  . Drug use: No  . Sexual activity: None  Other Topics Concern  . None  Social History Narrative  . None    Her Allergies Are:  No Known Allergies:   Her Current Medications Are:  Outpatient Encounter Medications as of 05/10/2017  Medication Sig  . aspirin EC 81 MG tablet Take 81 mg by mouth daily.  Marland Kitchen buPROPion (WELLBUTRIN XL) 300 MG 24 hr tablet Take 300 mg by mouth daily.  . citalopram (CELEXA) 20 MG tablet Take 20 mg by mouth daily.  . fluticasone (FLONASE) 50 MCG/ACT nasal spray Place 2 sprays into both nostrils daily.  Marland Kitchen glimepiride (AMARYL) 2 MG tablet Take 2 mg by mouth every evening.  Marland Kitchen glimepiride (AMARYL) 4 MG tablet Take 4 mg by mouth daily with breakfast.   . levothyroxine (SYNTHROID, LEVOTHROID) 125 MCG tablet Take 125 mcg by mouth daily before breakfast.  . loratadine (CLARITIN) 10 MG tablet Take 10 mg by mouth daily.  . metFORMIN  (GLUCOPHAGE) 1000 MG tablet Take 1,000 mg by mouth 2 (two) times daily with a meal.   No facility-administered encounter medications on file as of 05/10/2017.   :  Review of Systems:  Out of a complete 14 point review of systems, all are reviewed and negative with the exception of these symptoms as listed below: Review of Systems  Neurological:       Pt presents today to discuss her auto pap. Pt reports doing well.   Objective:  Neurological Exam  Physical Exam Physical Examination:   Vitals:   05/10/17 0936  BP: 140/82  Pulse: 79   General Examination: The patient is a very pleasant 51 y.o. female in no acute distress. She appears well-developed and well-nourished and well groomed.   HEENT: Normocephalic, atraumatic, pupils are equal, round and reactive to light and accommodation. Corrective  eyeglasses in place. Extraocular tracking is good without limitation to gaze excursion or nystagmus noted. Hearing is grossly intact. Face is symmetric with normal facial animation and normal facial sensation. Speech is clear with no dysarthria noted. There is no hypophonia. There is no lip, neck/head, jaw or voice tremor. Neck is supple with full range of passive and active motion. Oropharynx exam reveals: mild mouth dryness, good dental hygiene and mild airway crowding, tonsils are absent. Mallampati is class I. She has a minimal degree of erythema medial nostrils bilaterally.   Chest: Clear to auscultation without wheezing, rhonchi or crackles noted.  Heart: S1+S2+0, regular and normal without murmurs, rubs or gallops noted.   Abdomen: Soft, non-tender and non-distended with normal bowel sounds appreciated on auscultation.  Extremities: There is no pitting edema in the distal lower extremities bilaterally. Pedal pulses are intact.  Skin: Warm and dry without trophic changes noted. There are no varicose veins.  Musculoskeletal: exam reveals no obvious joint deformities, tenderness or  joint swelling or erythema.   Neurologically:  Mental status: The patient is awake, alert and oriented in all 4 spheres. Her immediate and remote memory, attention, language skills and fund of knowledge are appropriate. There is no evidence of aphasia, agnosia, apraxia or anomia. Speech is clear with normal prosody and enunciation. Thought process is linear. Mood is normal and affect is normal.  Cranial nerves II - XII are as described above under HEENT exam. In addition: shoulder shrug is normal with equal shoulder height noted. Motor exam: Normal bulk, strength and tone is noted. There is no drift, tremor or rebound. Romberg is negative. Reflexes are 2+ throughout. Fine motor skills and coordination: Grossly intact.  Cerebellar testing: No dysmetria or intention tremor. There is no truncal or gait ataxia.  Sensory exam: intact to light touch in the upper and lower extremities.  Gait, station and balance: She stands easily. No veering to one side is noted. No leaning to one side is noted. Posture is age-appropriate and stance is narrow based. Gait shows normal stride length and normal pace. No problems turning are noted. Tandem walk is unremarkable.  Assessment and plan:  In summary, LAIA WILEY is a very pleasant 51 year old female with an underlying medical history of type 2 diabetes, hypothyroidism, reflux disease, depression, hyperlipidemia, allergic rhinitis, plantar fasciitis, remote history of smoking and morbid obesity, who presents for follow-up consultation of her obstructive sleep apnea after home sleep testing and starting AutoPap therapy. She is fully compliant with treatment for which she is highly commended. She has also noticed improvement in her sleep quality and daytime tiredness. Her physical exam is stable. She is encouraged to continue with AutoPap therapy. Her current settings are adequate and she does not mind being on AutoPap. She has switched from a mask to nasal pillows.  She is tolerating these better. I suggested a six-month follow-up for a recheck and then hopefully yearly thereafter. I answered all her questions today and she was in agreement. Of note, she has occasionally woken up with a tingling particularly of her right upper extremity. That seems to be positional and she can improve it by shaking her arm and changing positions. She is advised that this is most likely positional. If she has more consistent numbness or tingling, we can certainly proceed with EMG and nerve conduction testing.  I spent 25 minutes in total face-to-face time with the patient, more than 50% of which was spent in counseling and coordination of care, reviewing test  results, reviewing medication and discussing or reviewing the diagnosis of OSA, its prognosis and treatment options. Pertinent laboratory and imaging test results that were available during this visit with the patient were reviewed by me and considered in my medical decision making (see chart for details).

## 2017-05-10 NOTE — Patient Instructions (Signed)
Please continue using your autoPAP regularly. While your insurance requires that you use PAP at least 4 hours each night on 70% of the nights, I recommend, that you not skip any nights and use it throughout the night if you can. Getting used to PAP and staying with the treatment long term does take time and patience and discipline. Untreated obstructive sleep apnea when it is moderate to severe can have an adverse impact on cardiovascular health and raise her risk for heart disease, arrhythmias, hypertension, congestive heart failure, stroke and diabetes. Untreated obstructive sleep apnea causes sleep disruption, nonrestorative sleep, and sleep deprivation. This can have an impact on your day to day functioning and cause daytime sleepiness and impairment of cognitive function, memory loss, mood disturbance, and problems focussing. Using PAP regularly can improve these symptoms.  Keep up the good work! We will see you back in 6 months for sleep apnea check up, and if you continue to do well, we can see you yearly thereafter.

## 2017-05-13 DIAGNOSIS — R300 Dysuria: Secondary | ICD-10-CM | POA: Diagnosis not present

## 2017-05-15 DIAGNOSIS — G4733 Obstructive sleep apnea (adult) (pediatric): Secondary | ICD-10-CM | POA: Diagnosis not present

## 2017-06-02 DIAGNOSIS — Z0000 Encounter for general adult medical examination without abnormal findings: Secondary | ICD-10-CM | POA: Diagnosis not present

## 2017-06-15 DIAGNOSIS — G4733 Obstructive sleep apnea (adult) (pediatric): Secondary | ICD-10-CM | POA: Diagnosis not present

## 2017-06-21 DIAGNOSIS — Z0000 Encounter for general adult medical examination without abnormal findings: Secondary | ICD-10-CM | POA: Diagnosis not present

## 2017-06-21 DIAGNOSIS — E1165 Type 2 diabetes mellitus with hyperglycemia: Secondary | ICD-10-CM | POA: Diagnosis not present

## 2017-06-21 DIAGNOSIS — Z1389 Encounter for screening for other disorder: Secondary | ICD-10-CM | POA: Diagnosis not present

## 2017-07-16 DIAGNOSIS — G4733 Obstructive sleep apnea (adult) (pediatric): Secondary | ICD-10-CM | POA: Diagnosis not present

## 2017-08-13 DIAGNOSIS — G4733 Obstructive sleep apnea (adult) (pediatric): Secondary | ICD-10-CM | POA: Diagnosis not present

## 2017-08-20 DIAGNOSIS — G4733 Obstructive sleep apnea (adult) (pediatric): Secondary | ICD-10-CM | POA: Diagnosis not present

## 2017-09-13 DIAGNOSIS — G4733 Obstructive sleep apnea (adult) (pediatric): Secondary | ICD-10-CM | POA: Diagnosis not present

## 2017-10-05 DIAGNOSIS — Z01419 Encounter for gynecological examination (general) (routine) without abnormal findings: Secondary | ICD-10-CM | POA: Diagnosis not present

## 2017-10-05 DIAGNOSIS — Z6836 Body mass index (BMI) 36.0-36.9, adult: Secondary | ICD-10-CM | POA: Diagnosis not present

## 2017-10-06 DIAGNOSIS — G4733 Obstructive sleep apnea (adult) (pediatric): Secondary | ICD-10-CM | POA: Diagnosis not present

## 2017-10-13 DIAGNOSIS — G4733 Obstructive sleep apnea (adult) (pediatric): Secondary | ICD-10-CM | POA: Diagnosis not present

## 2017-10-29 DIAGNOSIS — E1165 Type 2 diabetes mellitus with hyperglycemia: Secondary | ICD-10-CM | POA: Diagnosis not present

## 2017-10-29 DIAGNOSIS — I10 Essential (primary) hypertension: Secondary | ICD-10-CM | POA: Diagnosis not present

## 2017-11-01 DIAGNOSIS — Z1211 Encounter for screening for malignant neoplasm of colon: Secondary | ICD-10-CM | POA: Diagnosis not present

## 2017-11-01 DIAGNOSIS — K648 Other hemorrhoids: Secondary | ICD-10-CM | POA: Diagnosis not present

## 2017-11-03 ENCOUNTER — Encounter: Payer: Self-pay | Admitting: Adult Health

## 2017-11-03 DIAGNOSIS — Z8041 Family history of malignant neoplasm of ovary: Secondary | ICD-10-CM | POA: Diagnosis not present

## 2017-11-03 DIAGNOSIS — Z806 Family history of leukemia: Secondary | ICD-10-CM | POA: Diagnosis not present

## 2017-11-03 DIAGNOSIS — Z803 Family history of malignant neoplasm of breast: Secondary | ICD-10-CM | POA: Diagnosis not present

## 2017-11-08 ENCOUNTER — Encounter: Payer: Self-pay | Admitting: Adult Health

## 2017-11-08 ENCOUNTER — Ambulatory Visit (INDEPENDENT_AMBULATORY_CARE_PROVIDER_SITE_OTHER): Payer: 59 | Admitting: Adult Health

## 2017-11-08 VITALS — BP 127/78 | HR 64 | Ht 66.0 in | Wt 220.2 lb

## 2017-11-08 DIAGNOSIS — G4733 Obstructive sleep apnea (adult) (pediatric): Secondary | ICD-10-CM | POA: Diagnosis not present

## 2017-11-08 DIAGNOSIS — Z9989 Dependence on other enabling machines and devices: Secondary | ICD-10-CM | POA: Diagnosis not present

## 2017-11-08 NOTE — Patient Instructions (Signed)
Your Plan:  Continue using CPAP nightly and >4 hours each night If your symptoms worsen or you develop new symptoms please let us know.   Thank you for coming to see us at [facility]. I hope we have been able to provide you high quality care today.  You may receive a patient satisfaction survey over the next few weeks. We would appreciate your feedback and comments so that we may continue to improve ourselves and the health of our patients.  

## 2017-11-08 NOTE — Progress Notes (Addendum)
PATIENT: Doris Schmidt DOB: 23-Jan-1966  REASON FOR VISIT: follow up HISTORY FROM: patient  HISTORY OF PRESENT ILLNESS: Today 11/08/17:  Ms. Cid is a 52 year old female with a history of obstructive sleep apnea on CPAP.  Her CPAP download indicates that she use her machine 27 out of 30 days for compliance of 90%.  She used her machine greater than 4 hours 25 days for compliance of 83%.  On average she uses her machine 7 hours and 22 minutes.  Her residual AHI is 0.7 on 5 to 15 cm of water.  She does not have a significant leak.  She reports that she has done well with the CPAP.  She does states that her father recently passed away and she is been dealing with that.  She denies any new neurological symptoms.  She returns today for an evaluation.  HISTORY 05/10/2017: I reviewed her AutoPap compliance data from 04/06/2017 through 05/05/2017 which is a total of 30 days, during which time she used her AutoPap every night with percent used days greater than 4 hours at 100%, indicating superb compliance with an average usage of 7 hours and 22 minutes, residual AHI at goal at 1.3 per hour, 95th percentile of pressure at 12.3 cm, leak on the high side with the 95th percentile at 25 L/m on a pressure range of 5-15 cm with EPR. She reports feeling better. She indicates waking up better rested. Has has a few issues with congestion. She switched from a mask to nasal pillows which helped, had initial issues with nostril soreness.     REVIEW OF SYSTEMS: Out of a complete 14 system review of symptoms, the patient complains only of the following symptoms, and all other reviewed systems are negative.  Cough, insomnia, joint pain, aching muscles, moles, dizziness, numbness, headache, environmental allergies  ALLERGIES: No Known Allergies  HOME MEDICATIONS: Outpatient Medications Prior to Visit  Medication Sig Dispense Refill  . aspirin EC 81 MG tablet Take 81 mg by mouth daily.    Marland Kitchen buPROPion  (WELLBUTRIN XL) 300 MG 24 hr tablet Take 300 mg by mouth daily.    . citalopram (CELEXA) 20 MG tablet Take 20 mg by mouth daily.    . fluticasone (FLONASE) 50 MCG/ACT nasal spray Place 2 sprays into both nostrils daily.    Marland Kitchen levothyroxine (SYNTHROID, LEVOTHROID) 125 MCG tablet Take 125 mcg by mouth daily before breakfast.    . lisinopril (PRINIVIL,ZESTRIL) 2.5 MG tablet Take 2.5 mg by mouth daily.  3  . loratadine (CLARITIN) 10 MG tablet Take 10 mg by mouth daily.    . metFORMIN (GLUCOPHAGE) 1000 MG tablet Take 1,000 mg by mouth 2 (two) times daily with a meal.    . glimepiride (AMARYL) 2 MG tablet Take 2 mg by mouth every evening.    Marland Kitchen glimepiride (AMARYL) 4 MG tablet Take 4 mg by mouth daily with breakfast.      No facility-administered medications prior to visit.     PAST MEDICAL HISTORY: Past Medical History:  Diagnosis Date  . Allergic rhinitis   . Anxiety   . Depression   . Diabetes mellitus without complication (HCC)   . Dyspepsia   . GERD (gastroesophageal reflux disease)   . Gestational diabetes   . Hyperlipidemia   . Hypothyroidism   . Obesity   . Plantar fasciitis of right foot     PAST SURGICAL HISTORY:   FAMILY HISTORY: Family History  Problem Relation Age of Onset  . Hypertension  Mother   . Hyperlipidemia Mother   . Diabetes Mellitus II Father   . CAD Father   . Leukemia Father     SOCIAL HISTORY: Social History   Socioeconomic History  . Marital status: Married    Spouse name: Not on file  . Number of children: Not on file  . Years of education: Not on file  . Highest education level: Not on file  Occupational History  . Not on file  Social Needs  . Financial resource strain: Not on file  . Food insecurity:    Worry: Not on file    Inability: Not on file  . Transportation needs:    Medical: Not on file    Non-medical: Not on file  Tobacco Use  . Smoking status: Former Smoker    Last attempt to quit: 05/25/1996    Years since quitting: 21.4    . Smokeless tobacco: Never Used  Substance and Sexual Activity  . Alcohol use: Yes  . Drug use: No  . Sexual activity: Not on file  Lifestyle  . Physical activity:    Days per week: Not on file    Minutes per session: Not on file  . Stress: Not on file  Relationships  . Social connections:    Talks on phone: Not on file    Gets together: Not on file    Attends religious service: Not on file    Active member of club or organization: Not on file    Attends meetings of clubs or organizations: Not on file    Relationship status: Not on file  . Intimate partner violence:    Fear of current or ex partner: Not on file    Emotionally abused: Not on file    Physically abused: Not on file    Forced sexual activity: Not on file  Other Topics Concern  . Not on file  Social History Narrative  . Not on file      PHYSICAL EXAM  Vitals:   11/08/17 0811  BP: 127/78  Pulse: 64  Weight: 220 lb 3.2 oz (99.9 kg)  Height: 5\' 6"  (1.676 m)   Body mass index is 35.54 kg/m.  Generalized: Well developed, in no acute distress   Neurological examination  Mentation: Alert oriented to time, place, history taking. Follows all commands speech and language fluent Cranial nerve II-XII: Pupils were equal round reactive to light. Extraocular movements were full, visual field were full on confrontational test. Facial sensation and strength were normal. Uvula tongue midline. Head turning and shoulder shrug  were normal and symmetric.  Neck circumference 14 inches, Mallampati 3+ Motor: The motor testing reveals 5 over 5 strength of all 4 extremities. Good symmetric motor tone is noted throughout.  Sensory: Sensory testing is intact to soft touch on all 4 extremities. No evidence of extinction is noted.  Coordination: Cerebellar testing reveals good finger-nose-finger and heel-to-shin bilaterally.  Gait and station: Gait is normal.  Reflexes: Deep tendon reflexes are symmetric and normal bilaterally.    DIAGNOSTIC DATA (LABS, IMAGING, TESTING) - I reviewed patient records, labs, notes, testing and imaging myself where available.    ASSESSMENT AND PLAN 52 y.o. year old female  has a past medical history of Allergic rhinitis, Anxiety, Depression, Diabetes mellitus without complication (HCC), Dyspepsia, GERD (gastroesophageal reflux disease), Gestational diabetes, Hyperlipidemia, Hypothyroidism, Obesity, and Plantar fasciitis of right foot. here with:  1.  Obstructive sleep apnea on CPAP  The patient CPAP download shows excellent compliance and  good treatment of her apnea.  She is encouraged to continue using the CPAP nightly.  She is advised that if her symptoms worsen or she develops new symptoms she should let us know.  She will follow-up in 1 year or sooner if needed.  I spent 15 minutes with the patient. 50% of this time was spent reviewing her CPAP download   Butch PennyMegan Lily Kernen, MSN, NP-C 11/08/2017, 8:39 AM [facility] [address], [address] [place], [place] [address] [phone]  I reviewed the above note and documentation by the Nurse Practitioner and agree with the history, physical exam, assessment and plan as outlined above. I was immediately available for face-to-face consultation. Huston FoleySaima Athar, MD, PhD [facility]

## 2017-11-13 DIAGNOSIS — G4733 Obstructive sleep apnea (adult) (pediatric): Secondary | ICD-10-CM | POA: Diagnosis not present

## 2017-12-03 DIAGNOSIS — E119 Type 2 diabetes mellitus without complications: Secondary | ICD-10-CM | POA: Diagnosis not present

## 2017-12-13 DIAGNOSIS — G4733 Obstructive sleep apnea (adult) (pediatric): Secondary | ICD-10-CM | POA: Diagnosis not present

## 2017-12-16 DIAGNOSIS — Z809 Family history of malignant neoplasm, unspecified: Secondary | ICD-10-CM | POA: Diagnosis not present

## 2018-03-24 DIAGNOSIS — G4733 Obstructive sleep apnea (adult) (pediatric): Secondary | ICD-10-CM | POA: Diagnosis not present

## 2018-06-17 DIAGNOSIS — Z0000 Encounter for general adult medical examination without abnormal findings: Secondary | ICD-10-CM | POA: Diagnosis not present

## 2018-06-17 DIAGNOSIS — E038 Other specified hypothyroidism: Secondary | ICD-10-CM | POA: Diagnosis not present

## 2018-06-17 DIAGNOSIS — E119 Type 2 diabetes mellitus without complications: Secondary | ICD-10-CM | POA: Diagnosis not present

## 2018-06-24 DIAGNOSIS — E1165 Type 2 diabetes mellitus with hyperglycemia: Secondary | ICD-10-CM | POA: Diagnosis not present

## 2018-06-24 DIAGNOSIS — E7849 Other hyperlipidemia: Secondary | ICD-10-CM | POA: Diagnosis not present

## 2018-06-24 DIAGNOSIS — Z1339 Encounter for screening examination for other mental health and behavioral disorders: Secondary | ICD-10-CM | POA: Diagnosis not present

## 2018-06-24 DIAGNOSIS — I10 Essential (primary) hypertension: Secondary | ICD-10-CM | POA: Diagnosis not present

## 2018-06-24 DIAGNOSIS — E038 Other specified hypothyroidism: Secondary | ICD-10-CM | POA: Diagnosis not present

## 2018-06-24 DIAGNOSIS — Z0000 Encounter for general adult medical examination without abnormal findings: Secondary | ICD-10-CM | POA: Diagnosis not present

## 2018-06-27 DIAGNOSIS — G4733 Obstructive sleep apnea (adult) (pediatric): Secondary | ICD-10-CM | POA: Diagnosis not present

## 2018-09-27 DIAGNOSIS — G4733 Obstructive sleep apnea (adult) (pediatric): Secondary | ICD-10-CM | POA: Diagnosis not present

## 2018-10-24 DIAGNOSIS — E1165 Type 2 diabetes mellitus with hyperglycemia: Secondary | ICD-10-CM | POA: Diagnosis not present

## 2018-10-24 DIAGNOSIS — I10 Essential (primary) hypertension: Secondary | ICD-10-CM | POA: Diagnosis not present

## 2018-10-24 DIAGNOSIS — D649 Anemia, unspecified: Secondary | ICD-10-CM | POA: Diagnosis not present

## 2018-10-24 DIAGNOSIS — E668 Other obesity: Secondary | ICD-10-CM | POA: Diagnosis not present

## 2018-11-08 ENCOUNTER — Encounter: Payer: Self-pay | Admitting: Adult Health

## 2018-11-09 ENCOUNTER — Telehealth: Payer: Self-pay

## 2018-11-09 NOTE — Telephone Encounter (Signed)
Spoke with the patient and they have given verbal consent to file insurance and to do a mychart video visit. Mychart video link has been sent to the patient.   

## 2018-11-10 ENCOUNTER — Telehealth (INDEPENDENT_AMBULATORY_CARE_PROVIDER_SITE_OTHER): Payer: BC Managed Care – PPO | Admitting: Adult Health

## 2018-11-10 DIAGNOSIS — Z9989 Dependence on other enabling machines and devices: Secondary | ICD-10-CM | POA: Diagnosis not present

## 2018-11-10 DIAGNOSIS — G4733 Obstructive sleep apnea (adult) (pediatric): Secondary | ICD-10-CM

## 2018-11-10 NOTE — Progress Notes (Signed)
PATIENT: Doris Schmidt DOB: 10/26/1965  REASON FOR VISIT: follow up HISTORY FROM: patient  Virtual Visit via Video Note  I connected with Dan Humphreys on 11/10/18 at  8:00 AM EDT by a video enabled telemedicine application located remotely at [facility] and verified that I am speaking with the correct person using two identifiers who was located at their own home.   I discussed the limitations of evaluation and management by telemedicine and the availability of in person appointments. The patient expressed understanding and agreed to proceed.   PATIENT: Doris Schmidt DOB: 07/22/1965  REASON FOR VISIT: follow up HISTORY FROM: patient  HISTORY OF PRESENT ILLNESS: Today 11/10/18:  [name] is a 53 year old female with a history of obstructive sleep apnea on CPAP.  Her download indicates that she used her machine 26 out of 30 days for compliance of 87%.  She used her machine greater than 4 hours each night that she use the machine.  On average she uses her machine 7 hours and 50 minutes.  Her residual AHI is 1.5 on 5 to 15 cm of water with EPR of 2.  Her leak in the 95th percentile is 23.3 L/min.  She states that she currently has the nasal pillows.  She is asking for a different style of mask because she currently has some congestion and it is hard to use the nasal pillows.  HISTORY  11/08/17:  [name] is a 53 year old female with a history of obstructive sleep apnea on CPAP.  Her CPAP download indicates that she use her machine 27 out of 30 days for compliance of 90%.  She used her machine greater than 4 hours 25 days for compliance of 83%.  On average she uses her machine 7 hours and 22 minutes.  Her residual AHI is 0.7 on 5 to 15 cm of water.  She does not have a significant leak.  She reports that she has done well with the CPAP.  She does states that her father recently passed away and she is been dealing with that.  She denies any new neurological  symptoms.  She returns today for an evaluation.  REVIEW OF SYSTEMS: Out of a complete 14 system review of symptoms, the patient complains only of the following symptoms, and all other reviewed systems are negative.  See HPI  ALLERGIES: No Known Allergies  HOME MEDICATIONS: Outpatient Medications Prior to Visit  Medication Sig Dispense Refill   aspirin EC 81 MG tablet Take 81 mg by mouth daily.     buPROPion (WELLBUTRIN XL) 300 MG 24 hr tablet Take 300 mg by mouth daily.     citalopram (CELEXA) 20 MG tablet Take 20 mg by mouth daily.     fluticasone (FLONASE) 50 MCG/ACT nasal spray Place 2 sprays into both nostrils daily.     levothyroxine (SYNTHROID, LEVOTHROID) 125 MCG tablet Take 125 mcg by mouth daily before breakfast.     lisinopril (PRINIVIL,ZESTRIL) 2.5 MG tablet Take 2.5 mg by mouth daily.  3   loratadine (CLARITIN) 10 MG tablet Take 10 mg by mouth daily.     metFORMIN (GLUCOPHAGE) 1000 MG tablet Take 1,000 mg by mouth 2 (two) times daily with a meal.     No facility-administered medications prior to visit.     PAST MEDICAL HISTORY: Past Medical History:  Diagnosis Date   Allergic rhinitis    Anxiety    Depression    Diabetes mellitus without complication ([place])  Dyspepsia    GERD (gastroesophageal reflux disease)    Gestational diabetes    Hyperlipidemia    Hypothyroidism    Obesity    Plantar fasciitis of right foot     PAST SURGICAL HISTORY: Past Surgical History:  Procedure Laterality Date   TONSILLECTOMY AND ADENOIDECTOMY     TUBAL LIGATION     tubes in ears      FAMILY HISTORY: Family History  Problem Relation Age of Onset   Hypertension Mother    Hyperlipidemia Mother    Diabetes Mellitus II Father    CAD Father    Leukemia Father     SOCIAL HISTORY: Social History   Socioeconomic History   Marital status: Married    Spouse name: Not on file   Number of children: Not on file   Years of education: Not on file    Highest education level: Not on file  Occupational History   Not on file  Social Needs   Financial resource strain: Not on file   Food insecurity    Worry: Not on file    Inability: Not on file   Transportation needs    Medical: Not on file    Non-medical: Not on file  Tobacco Use   Smoking status: Former Smoker    Quit date: 05/25/1996    Years since quitting: 22.4   Smokeless tobacco: Never Used  Substance and Sexual Activity   Alcohol use: Yes   Drug use: No   Sexual activity: Not on file  Lifestyle   Physical activity    Days per week: Not on file    Minutes per session: Not on file   Stress: Not on file  Relationships   Social connections    Talks on phone: Not on file    Gets together: Not on file    Attends religious service: Not on file    Active member of club or organization: Not on file    Attends meetings of clubs or organizations: Not on file    Relationship status: Not on file   Intimate partner violence    Fear of current or ex partner: Not on file    Emotionally abused: Not on file    Physically abused: Not on file    Forced sexual activity: Not on file  Other Topics Concern   Not on file  Social History Narrative   Not on file      PHYSICAL EXAM Generalized: Well developed, in no acute distress   Neurological examination  Mentation: Alert oriented to time, place, history taking. Follows all commands speech and language fluent Cranial nerve II-XII:Extraocular movements were full. Facial symmetry noted. uvula tongue midline. Head turning and shoulder shrug  were normal and symmetric. Motor: Good strength throughout subjectively per patient Sensory: Sensory testing is intact to soft touch on all 4 extremities subjectively per patient Coordination: Cerebellar testing reveals good finger-nose-finger  Reflexes: UTA  DIAGNOSTIC DATA (LABS, IMAGING, TESTING) - I reviewed patient records, labs, notes, testing and imaging myself where  available.  Lab Results  Component Value Date   WBC 8.8 05/05/2007   HGB 13.4 05/05/2007   HCT 38.3 05/05/2007   MCV 86.2 05/05/2007   PLT 283 05/05/2007      Component Value Date/Time   NA 139 05/05/2007 1402   K 3.9 05/05/2007 1402   CL 102 05/05/2007 1402   CO2 28 05/05/2007 1402   GLUCOSE 108 (H) 05/05/2007 1402   BUN 8 05/05/2007 1402  CREATININE 0.78 05/05/2007 1402   CALCIUM 9.2 05/05/2007 1402   PROT 6.8 05/05/2007 1402   ALBUMIN 4.0 05/05/2007 1402   AST 17 05/05/2007 1402   ALT 20 05/05/2007 1402   ALKPHOS 99 05/05/2007 1402   BILITOT 0.6 05/05/2007 1402   GFRNONAA >60 05/05/2007 1402   GFRAA  05/05/2007 1402    >60        The eGFR has been calculated using the MDRD equation. This calculation has not been validated in all clinical      ASSESSMENT AND PLAN 53 y.o. year old female  has a past medical history of Allergic rhinitis, Anxiety, Depression, Diabetes mellitus without complication ([place]), Dyspepsia, GERD (gastroesophageal reflux disease), Gestational diabetes, Hyperlipidemia, Hypothyroidism, Obesity, and Plantar fasciitis of right foot. here with :  1.  Obstructive sleep apnea on CPAP  The patient CPAP download shows excellent compliance and good treatment of her apnea.  I will send an order to her DME company for a mask refitting.  She is encouraged to continue using the CPAP nightly and greater than 4 hours each night.  She is advised that if her symptoms worsen or she develops new symptoms she should let us know.  She will follow-up in 1 year or sooner if needed.  I spent 15 minutes with the patient. 50% of this time was spent was spent reviewing CPAP download   Ward Givens, MSN, NP-C 11/10/2018, 8:28 AM [facility] [address], [place], [place] [address] [phone]

## 2018-11-17 DIAGNOSIS — Z01419 Encounter for gynecological examination (general) (routine) without abnormal findings: Secondary | ICD-10-CM | POA: Diagnosis not present

## 2018-11-17 DIAGNOSIS — N951 Menopausal and female climacteric states: Secondary | ICD-10-CM | POA: Diagnosis not present

## 2018-11-17 DIAGNOSIS — Z1231 Encounter for screening mammogram for malignant neoplasm of breast: Secondary | ICD-10-CM | POA: Diagnosis not present

## 2018-11-17 DIAGNOSIS — Z6841 Body Mass Index (BMI) 40.0 and over, adult: Secondary | ICD-10-CM | POA: Diagnosis not present

## 2018-12-02 DIAGNOSIS — D649 Anemia, unspecified: Secondary | ICD-10-CM | POA: Diagnosis not present

## 2018-12-02 DIAGNOSIS — Z1331 Encounter for screening for depression: Secondary | ICD-10-CM | POA: Diagnosis not present

## 2018-12-02 DIAGNOSIS — E039 Hypothyroidism, unspecified: Secondary | ICD-10-CM | POA: Diagnosis not present

## 2018-12-02 DIAGNOSIS — E668 Other obesity: Secondary | ICD-10-CM | POA: Diagnosis not present

## 2018-12-02 DIAGNOSIS — E1165 Type 2 diabetes mellitus with hyperglycemia: Secondary | ICD-10-CM | POA: Diagnosis not present

## 2018-12-27 DIAGNOSIS — G4733 Obstructive sleep apnea (adult) (pediatric): Secondary | ICD-10-CM | POA: Diagnosis not present

## 2019-01-05 DIAGNOSIS — E119 Type 2 diabetes mellitus without complications: Secondary | ICD-10-CM | POA: Diagnosis not present

## 2019-01-05 DIAGNOSIS — Z6841 Body Mass Index (BMI) 40.0 and over, adult: Secondary | ICD-10-CM | POA: Diagnosis not present

## 2019-01-05 DIAGNOSIS — E785 Hyperlipidemia, unspecified: Secondary | ICD-10-CM | POA: Diagnosis not present

## 2019-02-15 DIAGNOSIS — E1165 Type 2 diabetes mellitus with hyperglycemia: Secondary | ICD-10-CM | POA: Diagnosis not present

## 2019-02-15 DIAGNOSIS — E038 Other specified hypothyroidism: Secondary | ICD-10-CM | POA: Diagnosis not present

## 2019-02-21 DIAGNOSIS — D649 Anemia, unspecified: Secondary | ICD-10-CM | POA: Diagnosis not present

## 2019-02-21 DIAGNOSIS — E1165 Type 2 diabetes mellitus with hyperglycemia: Secondary | ICD-10-CM | POA: Diagnosis not present

## 2019-02-21 DIAGNOSIS — E668 Other obesity: Secondary | ICD-10-CM | POA: Diagnosis not present

## 2019-02-21 DIAGNOSIS — I10 Essential (primary) hypertension: Secondary | ICD-10-CM | POA: Diagnosis not present

## 2019-03-03 DIAGNOSIS — E119 Type 2 diabetes mellitus without complications: Secondary | ICD-10-CM | POA: Diagnosis not present

## 2019-03-17 DIAGNOSIS — E119 Type 2 diabetes mellitus without complications: Secondary | ICD-10-CM | POA: Diagnosis not present

## 2019-03-17 DIAGNOSIS — E785 Hyperlipidemia, unspecified: Secondary | ICD-10-CM | POA: Diagnosis not present

## 2019-03-17 DIAGNOSIS — Z6841 Body Mass Index (BMI) 40.0 and over, adult: Secondary | ICD-10-CM | POA: Diagnosis not present

## 2019-03-22 DIAGNOSIS — E119 Type 2 diabetes mellitus without complications: Secondary | ICD-10-CM | POA: Diagnosis not present

## 2019-03-22 DIAGNOSIS — Z713 Dietary counseling and surveillance: Secondary | ICD-10-CM | POA: Diagnosis not present

## 2019-03-22 DIAGNOSIS — Z6841 Body Mass Index (BMI) 40.0 and over, adult: Secondary | ICD-10-CM | POA: Diagnosis not present

## 2019-04-04 DIAGNOSIS — E1165 Type 2 diabetes mellitus with hyperglycemia: Secondary | ICD-10-CM | POA: Diagnosis not present

## 2019-04-04 DIAGNOSIS — G4733 Obstructive sleep apnea (adult) (pediatric): Secondary | ICD-10-CM | POA: Diagnosis not present

## 2019-04-28 DIAGNOSIS — Z713 Dietary counseling and surveillance: Secondary | ICD-10-CM | POA: Diagnosis not present

## 2019-05-16 DIAGNOSIS — E785 Hyperlipidemia, unspecified: Secondary | ICD-10-CM | POA: Diagnosis not present

## 2019-05-16 DIAGNOSIS — G4733 Obstructive sleep apnea (adult) (pediatric): Secondary | ICD-10-CM | POA: Diagnosis not present

## 2019-05-16 DIAGNOSIS — E119 Type 2 diabetes mellitus without complications: Secondary | ICD-10-CM | POA: Diagnosis not present

## 2019-05-31 DIAGNOSIS — E785 Hyperlipidemia, unspecified: Secondary | ICD-10-CM | POA: Diagnosis not present

## 2019-05-31 DIAGNOSIS — D649 Anemia, unspecified: Secondary | ICD-10-CM | POA: Diagnosis not present

## 2019-05-31 DIAGNOSIS — E559 Vitamin D deficiency, unspecified: Secondary | ICD-10-CM | POA: Diagnosis not present

## 2019-05-31 DIAGNOSIS — Z6841 Body Mass Index (BMI) 40.0 and over, adult: Secondary | ICD-10-CM | POA: Diagnosis not present

## 2019-05-31 DIAGNOSIS — E119 Type 2 diabetes mellitus without complications: Secondary | ICD-10-CM | POA: Diagnosis not present

## 2019-05-31 DIAGNOSIS — I10 Essential (primary) hypertension: Secondary | ICD-10-CM | POA: Diagnosis not present

## 2019-06-12 DIAGNOSIS — Z87891 Personal history of nicotine dependence: Secondary | ICD-10-CM | POA: Diagnosis not present

## 2019-06-12 DIAGNOSIS — Z01818 Encounter for other preprocedural examination: Secondary | ICD-10-CM | POA: Diagnosis not present

## 2019-06-13 DIAGNOSIS — F54 Psychological and behavioral factors associated with disorders or diseases classified elsewhere: Secondary | ICD-10-CM | POA: Diagnosis not present

## 2019-06-13 DIAGNOSIS — Z7189 Other specified counseling: Secondary | ICD-10-CM | POA: Diagnosis not present

## 2019-06-13 DIAGNOSIS — Z6841 Body Mass Index (BMI) 40.0 and over, adult: Secondary | ICD-10-CM | POA: Diagnosis not present

## 2019-06-21 DIAGNOSIS — Z723 Lack of physical exercise: Secondary | ICD-10-CM | POA: Diagnosis not present

## 2019-06-22 DIAGNOSIS — E119 Type 2 diabetes mellitus without complications: Secondary | ICD-10-CM | POA: Diagnosis not present

## 2019-06-22 DIAGNOSIS — Z713 Dietary counseling and surveillance: Secondary | ICD-10-CM | POA: Diagnosis not present

## 2019-06-30 DIAGNOSIS — I10 Essential (primary) hypertension: Secondary | ICD-10-CM | POA: Diagnosis not present

## 2019-06-30 DIAGNOSIS — E039 Hypothyroidism, unspecified: Secondary | ICD-10-CM | POA: Diagnosis not present

## 2019-06-30 DIAGNOSIS — E785 Hyperlipidemia, unspecified: Secondary | ICD-10-CM | POA: Diagnosis not present

## 2019-06-30 DIAGNOSIS — E668 Other obesity: Secondary | ICD-10-CM | POA: Diagnosis not present

## 2019-06-30 DIAGNOSIS — N390 Urinary tract infection, site not specified: Secondary | ICD-10-CM | POA: Diagnosis not present

## 2019-06-30 DIAGNOSIS — Z0000 Encounter for general adult medical examination without abnormal findings: Secondary | ICD-10-CM | POA: Diagnosis not present

## 2019-06-30 DIAGNOSIS — E1165 Type 2 diabetes mellitus with hyperglycemia: Secondary | ICD-10-CM | POA: Diagnosis not present

## 2019-07-12 DIAGNOSIS — I10 Essential (primary) hypertension: Secondary | ICD-10-CM | POA: Diagnosis not present

## 2019-07-12 DIAGNOSIS — E119 Type 2 diabetes mellitus without complications: Secondary | ICD-10-CM | POA: Diagnosis not present

## 2019-07-12 DIAGNOSIS — E785 Hyperlipidemia, unspecified: Secondary | ICD-10-CM | POA: Diagnosis not present

## 2019-07-12 DIAGNOSIS — K219 Gastro-esophageal reflux disease without esophagitis: Secondary | ICD-10-CM | POA: Diagnosis not present

## 2019-07-20 DIAGNOSIS — G4733 Obstructive sleep apnea (adult) (pediatric): Secondary | ICD-10-CM | POA: Diagnosis not present

## 2019-07-25 DIAGNOSIS — E038 Other specified hypothyroidism: Secondary | ICD-10-CM | POA: Diagnosis not present

## 2019-07-25 DIAGNOSIS — E7849 Other hyperlipidemia: Secondary | ICD-10-CM | POA: Diagnosis not present

## 2019-07-25 DIAGNOSIS — I10 Essential (primary) hypertension: Secondary | ICD-10-CM | POA: Diagnosis not present

## 2019-07-27 DIAGNOSIS — Z713 Dietary counseling and surveillance: Secondary | ICD-10-CM | POA: Diagnosis not present

## 2019-07-27 DIAGNOSIS — E785 Hyperlipidemia, unspecified: Secondary | ICD-10-CM | POA: Diagnosis not present

## 2019-07-27 DIAGNOSIS — G4733 Obstructive sleep apnea (adult) (pediatric): Secondary | ICD-10-CM | POA: Diagnosis not present

## 2019-07-28 ENCOUNTER — Ambulatory Visit: Payer: Self-pay | Attending: Internal Medicine

## 2019-07-28 DIAGNOSIS — Z23 Encounter for immunization: Secondary | ICD-10-CM

## 2019-07-28 NOTE — Progress Notes (Signed)
   Covid-19 Vaccination Clinic  Name:  LAKERA VIALL    MRN: [id number] DOB: 06-07-1965  07/28/2019  Ms. Stachnik was observed post Covid-19 immunization for 15 minutes without incident. She was provided with Vaccine Information Sheet and instruction to access the V-Safe system.   Ms. Vanderhoof was instructed to call 911 with any severe reactions post vaccine: Marland Kitchen Difficulty breathing  . Swelling of face and throat  . A fast heartbeat  . A bad rash all over body  . Dizziness and weakness   Immunizations Administered    Name Date Dose VIS Date Route   Pfizer COVID-19 Vaccine 07/28/2019 11:05 AM 0.3 mL 05/05/2019 Intramuscular   Manufacturer: [employer], [employer]   Lot: [id number]   NDC: [address]-0

## 2019-08-04 DIAGNOSIS — Z01818 Encounter for other preprocedural examination: Secondary | ICD-10-CM | POA: Diagnosis not present

## 2019-08-07 DIAGNOSIS — E039 Hypothyroidism, unspecified: Secondary | ICD-10-CM | POA: Diagnosis not present

## 2019-08-07 DIAGNOSIS — Z6839 Body mass index (BMI) 39.0-39.9, adult: Secondary | ICD-10-CM | POA: Diagnosis not present

## 2019-08-07 DIAGNOSIS — E119 Type 2 diabetes mellitus without complications: Secondary | ICD-10-CM | POA: Diagnosis not present

## 2019-08-07 DIAGNOSIS — E785 Hyperlipidemia, unspecified: Secondary | ICD-10-CM | POA: Diagnosis not present

## 2019-08-07 DIAGNOSIS — J309 Allergic rhinitis, unspecified: Secondary | ICD-10-CM | POA: Diagnosis not present

## 2019-08-07 DIAGNOSIS — K219 Gastro-esophageal reflux disease without esophagitis: Secondary | ICD-10-CM | POA: Diagnosis not present

## 2019-08-07 DIAGNOSIS — F329 Major depressive disorder, single episode, unspecified: Secondary | ICD-10-CM | POA: Diagnosis not present

## 2019-08-07 DIAGNOSIS — G4733 Obstructive sleep apnea (adult) (pediatric): Secondary | ICD-10-CM | POA: Diagnosis not present

## 2019-08-07 DIAGNOSIS — Z6841 Body Mass Index (BMI) 40.0 and over, adult: Secondary | ICD-10-CM | POA: Diagnosis not present

## 2019-08-07 DIAGNOSIS — Z79899 Other long term (current) drug therapy: Secondary | ICD-10-CM | POA: Diagnosis not present

## 2019-08-07 DIAGNOSIS — Z7984 Long term (current) use of oral hypoglycemic drugs: Secondary | ICD-10-CM | POA: Diagnosis not present

## 2019-08-21 DIAGNOSIS — M25512 Pain in left shoulder: Secondary | ICD-10-CM | POA: Diagnosis not present

## 2019-08-23 ENCOUNTER — Ambulatory Visit: Payer: Self-pay | Attending: Internal Medicine

## 2019-08-23 DIAGNOSIS — Z23 Encounter for immunization: Secondary | ICD-10-CM

## 2019-08-23 NOTE — Progress Notes (Signed)
   Covid-19 Vaccination Clinic  Name:  Doris Schmidt    MRN: [id number] DOB: 22-Jan-1966  08/23/2019  [name] was observed post Covid-19 immunization for 15 minutes without incident. She was provided with Vaccine Information Sheet and instruction to access the V-Safe system.   [name] was instructed to call 911 with any severe reactions post vaccine: Marland Kitchen Difficulty breathing  . Swelling of face and throat  . A fast heartbeat  . A bad rash all over body  . Dizziness and weakness   Immunizations Administered    Name Date Dose VIS Date Route   Pfizer COVID-19 Vaccine 08/23/2019 11:20 AM 0.3 mL 05/05/2019 Intramuscular   Manufacturer: [employer], [employer]   Lot: [id number]   NDC: [address]-5

## 2019-08-24 DIAGNOSIS — Z713 Dietary counseling and surveillance: Secondary | ICD-10-CM | POA: Diagnosis not present

## 2019-08-24 DIAGNOSIS — E119 Type 2 diabetes mellitus without complications: Secondary | ICD-10-CM | POA: Diagnosis not present

## 2019-10-04 DIAGNOSIS — I10 Essential (primary) hypertension: Secondary | ICD-10-CM | POA: Diagnosis not present

## 2019-10-04 DIAGNOSIS — E1165 Type 2 diabetes mellitus with hyperglycemia: Secondary | ICD-10-CM | POA: Diagnosis not present

## 2019-10-18 DIAGNOSIS — G4733 Obstructive sleep apnea (adult) (pediatric): Secondary | ICD-10-CM | POA: Diagnosis not present

## 2019-11-02 ENCOUNTER — Ambulatory Visit: Payer: Self-pay | Admitting: Adult Health

## 2019-11-07 DIAGNOSIS — M25512 Pain in left shoulder: Secondary | ICD-10-CM | POA: Diagnosis not present

## 2019-11-21 ENCOUNTER — Encounter: Payer: Self-pay | Admitting: Neurology

## 2019-11-21 ENCOUNTER — Telehealth (INDEPENDENT_AMBULATORY_CARE_PROVIDER_SITE_OTHER): Payer: BC Managed Care – PPO | Admitting: Neurology

## 2019-11-21 DIAGNOSIS — G4733 Obstructive sleep apnea (adult) (pediatric): Secondary | ICD-10-CM | POA: Diagnosis not present

## 2019-11-21 DIAGNOSIS — Z9989 Dependence on other enabling machines and devices: Secondary | ICD-10-CM

## 2019-11-21 NOTE — Progress Notes (Signed)
Order for Cpap supplies submitted to [employer].

## 2019-11-21 NOTE — Progress Notes (Signed)
Interim history:  [name] is a 54 year old right-handed woman with an underlying medical history of type 2 diabetes, hypothyroidism, reflux disease, depression, hyperlipidemia, allergic rhinitis, plantar fasciitis,  remote history of smoking, obesity with s/p gastric bypass, who presents for a  Virtual, video based follow-up appointment via [facility] video visit for follow-up consultation of her obstructive sleep apnea, on AutoPap therapy. The patient is unaccompanied today and joins via computer from her home, I am located in my office. I last saw her on 05/10/17, at which time she was compliant with autoPAP and reported feeling better.   She saw Ward Givens, NP in the interim on 11/08/17, at which time she was compliant with treatment. She had recently lost her dad.   She had a virtual visit on 11/11/18 with Megan and was compliant with autoPAP.  Today, 11/21/19: I reviewed her autoPAP compliance data from 10/21/2019 through 11/19/2019, which is a total of 30 days, during which time she used her machine 12 days with percent use days greater than 4 hours at 37%, indicating suboptimal compliance, average usage for days on treatment good at 7 hours and 23 minutes, residual AHI at goal at 3.1/h, average pressure for the 95th percentile at 8 cm with a range of 5 to 15 cm with EPR.  Leak in the high acceptable range, with the 95th percentile at 19.7 L/min,  She reports doing well, she did have some travel related lapses in treatment but generally still uses her CPAP on a regular basis.  She has been told by her husband that when she sleeps without it she does not snore nearly as much as she used to.  She has lost about 55 pounds thus far and has a follow-up appointment with her nutritionist.  Of note, she had gastric bypass surgery through [facility] on 08/07/2019.  She feels well.  She is able to add more food to her diet.  She had been taken off of her diabetes medications and also has a follow-up pending  with her diabetes specialist.  She has noticed that her nasal pillows do not fit quite as snugly as they used to.  She had a more traditional nasal pillows interface and is now using a newer style.  She would be interested in getting refitted with a mask.  The patient's allergies, current medications, family history, past medical history, past social history, past surgical history and problem list were reviewed and updated as appropriate.    Previously (copied from previous notes for reference):   I first met her on 02/08/2017 at the request of her primary care physician, at which time she reported snoring and daytime somnolence and a negative sleep study over 10 years ago. I suggested we bring her in for sleep study. Her insurance denied an attended sleep study. She had a home sleep test on 02/24/2017 which indicated moderate obstructive sleep apnea, AHI of 29.3 per hour, average oxygen saturation of 94%, nadir of 84%, time below or at 88% saturation of 8 minutes. Her insurance denied and attended CPAP titration study and she was advised to start AutoPap therapy at home.   I reviewed her AutoPap compliance data from 04/06/2017 through 05/05/2017 which is a total of 30 days, during which time she used her AutoPap every night with percent used days greater than 4 hours at 100%, indicating superb compliance with an average usage of 7 hours and 22 minutes, residual AHI at goal at 1.3 per hour, 95th percentile of pressure at  12.3 cm, leak on the high side with the 95th percentile at 25 L/m on a pressure range of 5-15 cm with EPR.    02/08/2017: (She) reports snoring and excessive daytime somnolence. I reviewed your office note from 12/08/2016, which kindly included. She had a sleep study many years ago which was reportedly negative for sleep apnea. Prior sleep study results are not available for my review today.  She believes her sleep study was about 10 years ago or so. Her snoring has become worse in the  past few years, in the past several months her daytime somnolence has become worse. Her Epworth sleepiness score is 14 or 15 out of 24, fatigue score is 35 out of 63. She does not wake up rested, she has multiple nighttime awakenings. She does not always allow for 7 or 8 hours of sleep. Bedtime is around 11 PM, wakeup time around 6. She does have a somewhat flexible work schedule at this time. She is also transitioning into a new overall at her workplace. She has 2 children, ages 18 and 47. She quit smoking in 1998, drinks alcohol infrequently, about twice a month or so, one large cup of coffee per day typically and no day-to-day sodas or tea. She had more stress in the recent past. Last August her father was diagnosed with leukemia. He is doing better thankfully. Her weight has remained fairly stable, she did have some weight loss but gained it back for the most part. She endorses restless leg symptoms and has been told by her husband that she moves a lot in her sleep including rubbing her feet. She does not typically have night to night nocturia but does have occasional morning headaches. She is not aware of any family history of OSA but does remember that her father snores loudly.    Her Past Medical History Is Significant For: Past Medical History:  Diagnosis Date  . Allergic rhinitis   . Anxiety   . Depression   . Diabetes mellitus without complication ([place])   . Dyspepsia   . GERD (gastroesophageal reflux disease)   . Gestational diabetes   . Hyperlipidemia   . Hypothyroidism   . Obesity   . Plantar fasciitis of right foot     Her Past Surgical History Is Significant For: Past Surgical History:  Procedure Laterality Date  . TONSILLECTOMY AND ADENOIDECTOMY    . TUBAL LIGATION    . tubes in ears      Her Family History Is Significant For: Family History  Problem Relation Age of Onset  . Hypertension Mother   . Hyperlipidemia Mother   . Diabetes Mellitus II Father   . CAD Father     . Leukemia Father     Her Social History Is Significant For: Social History   Socioeconomic History  . Marital status: Married    Spouse name: Not on file  . Number of children: Not on file  . Years of education: Not on file  . Highest education level: Not on file  Occupational History  . Not on file  Tobacco Use  . Smoking status: Former Smoker    Quit date: 05/25/1996    Years since quitting: 23.5  . Smokeless tobacco: Never Used  Substance and Sexual Activity  . Alcohol use: Yes  . Drug use: No  . Sexual activity: Not on file  Other Topics Concern  . Not on file  Social History Narrative  . Not on file   Social Determinants of  Health   Financial Resource Strain:   . Difficulty of Paying Living Expenses:   Food Insecurity:   . Worried About Charity fundraiser in the Last Year:   . Arboriculturist in the Last Year:   Transportation Needs:   . Film/video editor (Medical):   Marland Kitchen Lack of Transportation (Non-Medical):   Physical Activity:   . Days of Exercise per Week:   . Minutes of Exercise per Session:   Stress:   . Feeling of Stress :   Social Connections:   . Frequency of Communication with Friends and Family:   . Frequency of Social Gatherings with Friends and Family:   . Attends Religious Services:   . Active Member of Clubs or Organizations:   . Attends Archivist Meetings:   Marland Kitchen Marital Status:     Her Allergies Are:  No Known Allergies:   Her Current Medications Are:  Outpatient Encounter Medications as of 11/21/2019  Medication Sig  . aspirin EC 81 MG tablet Take 81 mg by mouth daily.  Marland Kitchen buPROPion (WELLBUTRIN XL) 300 MG 24 hr tablet Take 300 mg by mouth daily.  . citalopram (CELEXA) 20 MG tablet Take 20 mg by mouth daily.  . fluticasone (FLONASE) 50 MCG/ACT nasal spray Place 2 sprays into both nostrils daily.  Marland Kitchen levothyroxine (SYNTHROID, LEVOTHROID) 125 MCG tablet Take 125 mcg by mouth daily before breakfast.  . lisinopril  (PRINIVIL,ZESTRIL) 2.5 MG tablet Take 2.5 mg by mouth daily.  Marland Kitchen loratadine (CLARITIN) 10 MG tablet Take 10 mg by mouth daily.  . metFORMIN (GLUCOPHAGE) 1000 MG tablet Take 1,000 mg by mouth 2 (two) times daily with a meal.   No facility-administered encounter medications on file as of 11/21/2019.  :  Review of Systems:  Out of a complete 14 point review of systems, all are reviewed and negative with the exception of these symptoms as listed below:  Virtual Visit via Video Note on 11/21/2019:   I connected with Doris Schmidt on 11/21/19 at 10:30 AM EDT by a video enabled telemedicine application and verified that I am speaking with the correct person using two identifiers.   I discussed the limitations of evaluation and management by telemedicine and the availability of in person appointments. The patient expressed understanding and agreed to proceed.  History of Present Illness:    Observations/Objective: She is pleasant, conversant, in no acute distress.  HEENT exam reveals normal tracking, corrective eyeglasses in place, hearing grossly intact, face is symmetric with normal facial animation.  Upper body movements are unremarkable, no shortness of breath noted, able to communicate without problems.  Assessment and Plan: In summary,Doris M Soperis a very pleasant 85 year oldfemalewith an underlying medical history of type 2 diabetes, hypothyroidism, reflux disease, depression, hyperlipidemia, allergic rhinitis, plantar fasciitis, remote history of smoking and morbid obesity with recent status post weight loss surgery and significant weight loss is achieved already, who presents for a virtual follow-up appointment for her obstructive sleep apnea.  She had home sleep testing in 2018 and started AutoPap therapy in October 2018.  She has been fully compliant with treatment.  Lately, she had had some lapses in treatment secondary to travel.  She had some changes in her mask fit.  She is encouraged  to seek an appointment for a mask refit through her DME company.  I also placed a referral for this into her chart.  She is commended for her treatment adherence and weight loss endeavors.  She is advised  to follow-up in 6 months and we will likely proceed with a reassessment of her sleep apnea with a home sleep test at the time.  She is agreeable to this plan.  I answered all her questions today and she is encouraged to call with any interim questions or concerns.   Follow Up Instructions:  Continue AutoPap.  Follow-up in 6 months.  Consider repeat home sleep testing next time.  Seek mask refit appointment through DME.   I discussed the assessment and treatment plan with the patient. The patient was provided an opportunity to ask questions and all were answered. The patient agreed with the plan and demonstrated an understanding of the instructions.   The patient was advised to call back or seek an in-person evaluation if the symptoms worsen or if the condition fails to improve as anticipated.  I provided 20 minutes of non-face-to-face time during this encounter.   Star Age, MD

## 2019-11-21 NOTE — Patient Instructions (Signed)
Given verbally, during today's virtual video-based encounter, with verbal feedback received.   

## 2019-11-23 DIAGNOSIS — G4733 Obstructive sleep apnea (adult) (pediatric): Secondary | ICD-10-CM | POA: Diagnosis not present

## 2019-11-28 ENCOUNTER — Ambulatory Visit: Payer: Self-pay | Admitting: Adult Health

## 2019-12-20 DIAGNOSIS — Z6831 Body mass index (BMI) 31.0-31.9, adult: Secondary | ICD-10-CM | POA: Diagnosis not present

## 2019-12-20 DIAGNOSIS — Z1231 Encounter for screening mammogram for malignant neoplasm of breast: Secondary | ICD-10-CM | POA: Diagnosis not present

## 2019-12-20 DIAGNOSIS — Z01419 Encounter for gynecological examination (general) (routine) without abnormal findings: Secondary | ICD-10-CM | POA: Diagnosis not present

## 2020-01-30 DIAGNOSIS — L988 Other specified disorders of the skin and subcutaneous tissue: Secondary | ICD-10-CM | POA: Diagnosis not present

## 2020-01-30 DIAGNOSIS — E119 Type 2 diabetes mellitus without complications: Secondary | ICD-10-CM | POA: Diagnosis not present

## 2020-02-06 DIAGNOSIS — Z9884 Bariatric surgery status: Secondary | ICD-10-CM | POA: Diagnosis not present

## 2020-02-06 DIAGNOSIS — R079 Chest pain, unspecified: Secondary | ICD-10-CM | POA: Diagnosis not present

## 2020-02-06 DIAGNOSIS — K912 Postsurgical malabsorption, not elsewhere classified: Secondary | ICD-10-CM | POA: Diagnosis not present

## 2020-02-06 DIAGNOSIS — E785 Hyperlipidemia, unspecified: Secondary | ICD-10-CM | POA: Diagnosis not present

## 2020-02-06 DIAGNOSIS — F329 Major depressive disorder, single episode, unspecified: Secondary | ICD-10-CM | POA: Diagnosis not present

## 2020-02-06 DIAGNOSIS — E039 Hypothyroidism, unspecified: Secondary | ICD-10-CM | POA: Diagnosis not present

## 2020-02-13 DIAGNOSIS — I10 Essential (primary) hypertension: Secondary | ICD-10-CM | POA: Diagnosis not present

## 2020-02-13 DIAGNOSIS — Z9884 Bariatric surgery status: Secondary | ICD-10-CM | POA: Diagnosis not present

## 2020-02-13 DIAGNOSIS — K912 Postsurgical malabsorption, not elsewhere classified: Secondary | ICD-10-CM | POA: Diagnosis not present

## 2020-02-21 ENCOUNTER — Other Ambulatory Visit: Payer: Self-pay | Admitting: Internal Medicine

## 2020-02-21 DIAGNOSIS — R946 Abnormal results of thyroid function studies: Secondary | ICD-10-CM

## 2020-02-22 ENCOUNTER — Ambulatory Visit
Admission: RE | Admit: 2020-02-22 | Discharge: 2020-02-22 | Disposition: A | Discharge status: Home / Self Care | Payer: BC Managed Care – PPO | Source: Ambulatory Visit | Attending: Internal Medicine | Admitting: Internal Medicine

## 2020-02-22 DIAGNOSIS — R946 Abnormal results of thyroid function studies: Secondary | ICD-10-CM

## 2020-03-07 DIAGNOSIS — G4733 Obstructive sleep apnea (adult) (pediatric): Secondary | ICD-10-CM | POA: Diagnosis not present

## 2020-03-12 DIAGNOSIS — E119 Type 2 diabetes mellitus without complications: Secondary | ICD-10-CM | POA: Diagnosis not present

## 2020-04-12 DIAGNOSIS — H2143 Pupillary membranes, bilateral: Secondary | ICD-10-CM | POA: Diagnosis not present

## 2020-04-23 DIAGNOSIS — Z20822 Contact with and (suspected) exposure to covid-19: Secondary | ICD-10-CM | POA: Diagnosis not present

## 2020-04-23 DIAGNOSIS — Z03818 Encounter for observation for suspected exposure to other biological agents ruled out: Secondary | ICD-10-CM | POA: Diagnosis not present

## 2020-06-26 DIAGNOSIS — E039 Hypothyroidism, unspecified: Secondary | ICD-10-CM | POA: Diagnosis not present

## 2020-06-26 DIAGNOSIS — E785 Hyperlipidemia, unspecified: Secondary | ICD-10-CM | POA: Diagnosis not present

## 2020-06-26 DIAGNOSIS — E1165 Type 2 diabetes mellitus with hyperglycemia: Secondary | ICD-10-CM | POA: Diagnosis not present

## 2020-07-01 DIAGNOSIS — I10 Essential (primary) hypertension: Secondary | ICD-10-CM | POA: Diagnosis not present

## 2020-07-01 DIAGNOSIS — Z0000 Encounter for general adult medical examination without abnormal findings: Secondary | ICD-10-CM | POA: Diagnosis not present

## 2020-07-01 DIAGNOSIS — E119 Type 2 diabetes mellitus without complications: Secondary | ICD-10-CM | POA: Diagnosis not present

## 2020-07-01 DIAGNOSIS — Z23 Encounter for immunization: Secondary | ICD-10-CM | POA: Diagnosis not present

## 2020-07-01 DIAGNOSIS — R82998 Other abnormal findings in urine: Secondary | ICD-10-CM | POA: Diagnosis not present

## 2020-07-01 DIAGNOSIS — Z1212 Encounter for screening for malignant neoplasm of rectum: Secondary | ICD-10-CM | POA: Diagnosis not present

## 2020-07-01 DIAGNOSIS — E785 Hyperlipidemia, unspecified: Secondary | ICD-10-CM | POA: Diagnosis not present

## 2020-08-02 DIAGNOSIS — Z9884 Bariatric surgery status: Secondary | ICD-10-CM | POA: Diagnosis not present

## 2020-08-02 DIAGNOSIS — K912 Postsurgical malabsorption, not elsewhere classified: Secondary | ICD-10-CM | POA: Diagnosis not present

## 2020-08-13 DIAGNOSIS — K912 Postsurgical malabsorption, not elsewhere classified: Secondary | ICD-10-CM | POA: Diagnosis not present

## 2020-08-13 DIAGNOSIS — Z9884 Bariatric surgery status: Secondary | ICD-10-CM | POA: Diagnosis not present

## 2020-08-15 DIAGNOSIS — Z03818 Encounter for observation for suspected exposure to other biological agents ruled out: Secondary | ICD-10-CM | POA: Diagnosis not present

## 2020-08-15 DIAGNOSIS — Z20822 Contact with and (suspected) exposure to covid-19: Secondary | ICD-10-CM | POA: Diagnosis not present

## 2020-10-11 DIAGNOSIS — Z13820 Encounter for screening for osteoporosis: Secondary | ICD-10-CM | POA: Diagnosis not present

## 2021-02-02 DIAGNOSIS — Z713 Dietary counseling and surveillance: Secondary | ICD-10-CM | POA: Diagnosis not present

## 2021-03-18 DIAGNOSIS — Z1231 Encounter for screening mammogram for malignant neoplasm of breast: Secondary | ICD-10-CM | POA: Diagnosis not present

## 2021-04-21 DIAGNOSIS — K760 Fatty (change of) liver, not elsewhere classified: Secondary | ICD-10-CM | POA: Diagnosis not present

## 2021-04-21 DIAGNOSIS — I10 Essential (primary) hypertension: Secondary | ICD-10-CM | POA: Diagnosis not present

## 2021-04-21 DIAGNOSIS — E1165 Type 2 diabetes mellitus with hyperglycemia: Secondary | ICD-10-CM | POA: Diagnosis not present

## 2021-04-25 DIAGNOSIS — E039 Hypothyroidism, unspecified: Secondary | ICD-10-CM | POA: Diagnosis not present

## 2021-05-12 DIAGNOSIS — Z01419 Encounter for gynecological examination (general) (routine) without abnormal findings: Secondary | ICD-10-CM | POA: Diagnosis not present

## 2021-05-12 DIAGNOSIS — Z6834 Body mass index (BMI) 34.0-34.9, adult: Secondary | ICD-10-CM | POA: Diagnosis not present

## 2021-07-11 DIAGNOSIS — E039 Hypothyroidism, unspecified: Secondary | ICD-10-CM | POA: Diagnosis not present

## 2021-07-11 DIAGNOSIS — E1165 Type 2 diabetes mellitus with hyperglycemia: Secondary | ICD-10-CM | POA: Diagnosis not present

## 2021-07-17 DIAGNOSIS — Z1331 Encounter for screening for depression: Secondary | ICD-10-CM | POA: Diagnosis not present

## 2021-07-17 DIAGNOSIS — E1165 Type 2 diabetes mellitus with hyperglycemia: Secondary | ICD-10-CM | POA: Diagnosis not present

## 2021-07-17 DIAGNOSIS — Z0000 Encounter for general adult medical examination without abnormal findings: Secondary | ICD-10-CM | POA: Diagnosis not present

## 2021-07-22 DIAGNOSIS — Z9884 Bariatric surgery status: Secondary | ICD-10-CM | POA: Diagnosis not present

## 2021-07-22 DIAGNOSIS — K912 Postsurgical malabsorption, not elsewhere classified: Secondary | ICD-10-CM | POA: Diagnosis not present

## 2021-07-31 DIAGNOSIS — K912 Postsurgical malabsorption, not elsewhere classified: Secondary | ICD-10-CM | POA: Diagnosis not present

## 2021-07-31 DIAGNOSIS — Z9884 Bariatric surgery status: Secondary | ICD-10-CM | POA: Diagnosis not present

## 2021-08-14 DIAGNOSIS — Z9884 Bariatric surgery status: Secondary | ICD-10-CM | POA: Diagnosis not present

## 2021-08-14 DIAGNOSIS — D509 Iron deficiency anemia, unspecified: Secondary | ICD-10-CM | POA: Diagnosis not present

## 2021-08-14 DIAGNOSIS — K912 Postsurgical malabsorption, not elsewhere classified: Secondary | ICD-10-CM | POA: Diagnosis not present

## 2021-08-27 DIAGNOSIS — K912 Postsurgical malabsorption, not elsewhere classified: Secondary | ICD-10-CM | POA: Diagnosis not present

## 2021-08-27 DIAGNOSIS — Z9884 Bariatric surgery status: Secondary | ICD-10-CM | POA: Diagnosis not present

## 2021-08-27 DIAGNOSIS — D508 Other iron deficiency anemias: Secondary | ICD-10-CM | POA: Diagnosis not present

## 2021-10-07 DIAGNOSIS — D508 Other iron deficiency anemias: Secondary | ICD-10-CM | POA: Diagnosis not present

## 2021-10-14 DIAGNOSIS — D508 Other iron deficiency anemias: Secondary | ICD-10-CM | POA: Diagnosis not present

## 2021-11-18 DIAGNOSIS — D508 Other iron deficiency anemias: Secondary | ICD-10-CM | POA: Diagnosis not present

## 2021-11-28 DIAGNOSIS — Z794 Long term (current) use of insulin: Secondary | ICD-10-CM | POA: Diagnosis not present

## 2021-11-28 DIAGNOSIS — E1165 Type 2 diabetes mellitus with hyperglycemia: Secondary | ICD-10-CM | POA: Diagnosis not present

## 2021-11-28 DIAGNOSIS — I10 Essential (primary) hypertension: Secondary | ICD-10-CM | POA: Diagnosis not present

## 2021-11-28 DIAGNOSIS — E119 Type 2 diabetes mellitus without complications: Secondary | ICD-10-CM | POA: Diagnosis not present

## 2021-12-23 DIAGNOSIS — E119 Type 2 diabetes mellitus without complications: Secondary | ICD-10-CM | POA: Diagnosis not present

## 2021-12-30 DIAGNOSIS — Z713 Dietary counseling and surveillance: Secondary | ICD-10-CM | POA: Diagnosis not present

## 2022-01-13 IMAGING — US US THYROID
1 series · 14 of 25 positions shown · non-contrast
Comparison: None.

CLINICAL DATA: Abnormal thyroid function test.

EXAM:
THYROID ULTRASOUND
TECHNIQUE: Ultrasound examination of the thyroid gland and adjacent soft
tissues was performed.

[Series 1: us thyroid · 0.04mm/px · 14 of 43 slices shown]
[im 1/43]
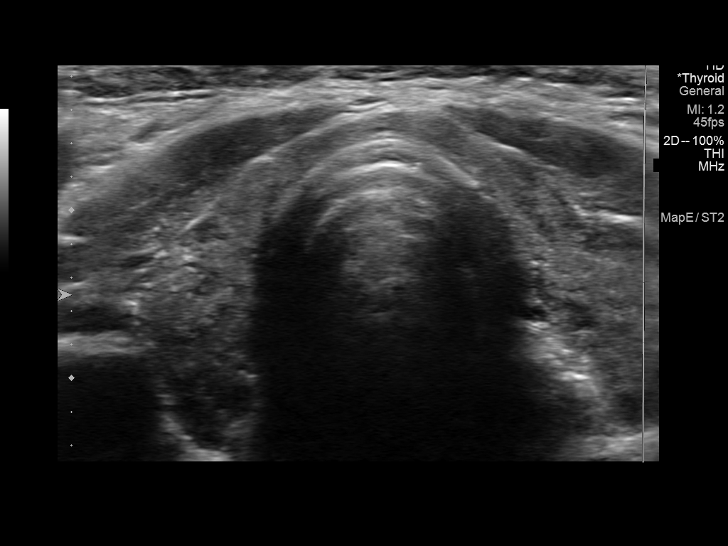
[im 4/43]
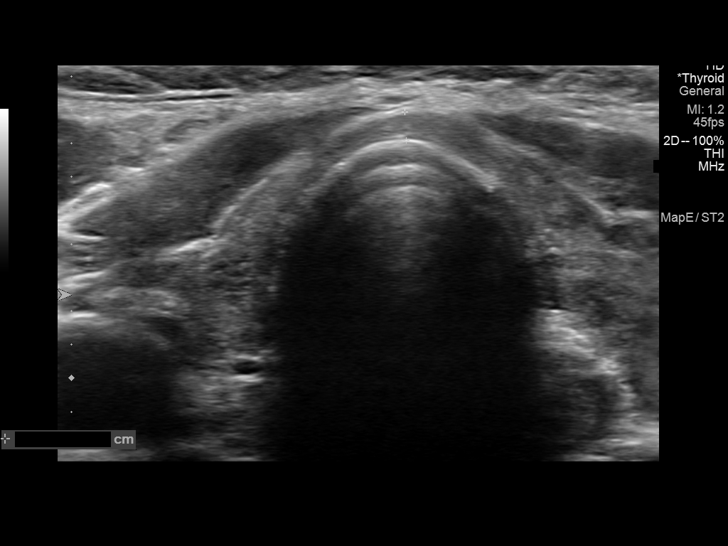
[im 8/43]
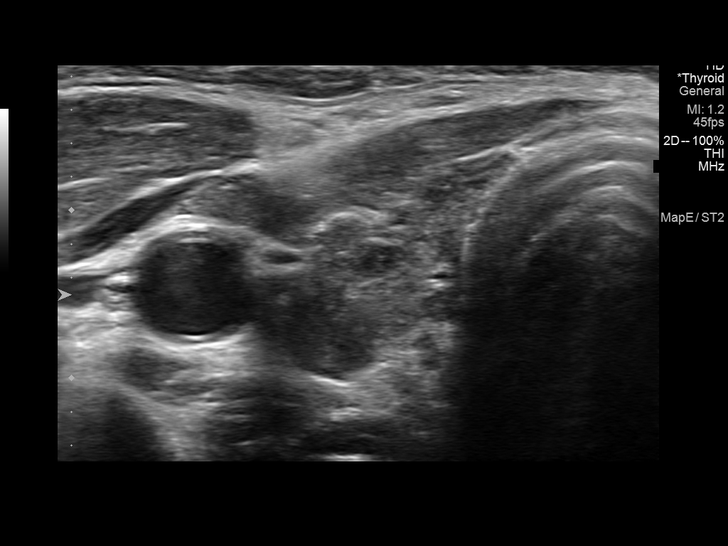
[im 11/43]
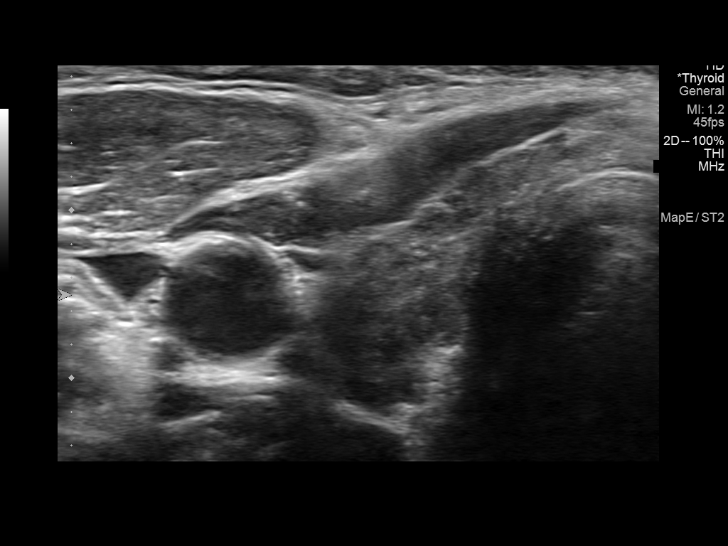
[im 15/43]
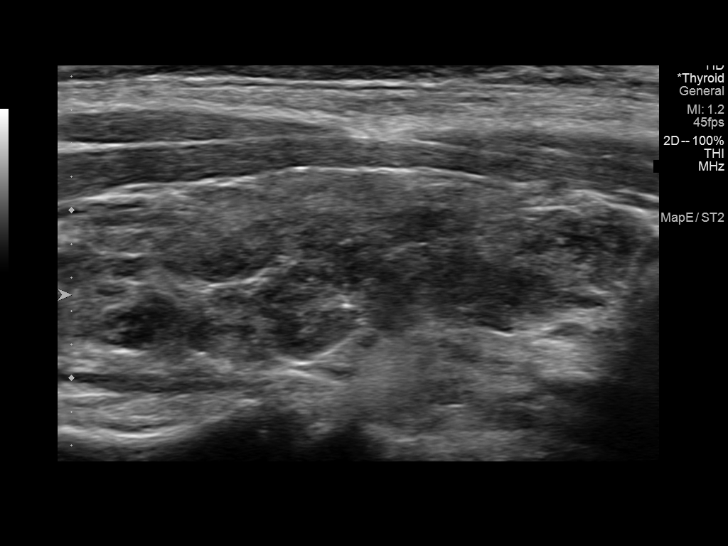
[im 16/43]
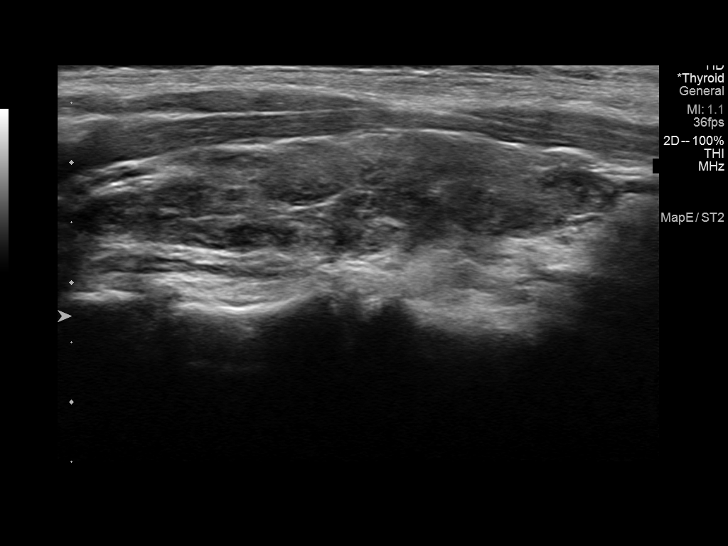
[im 20/43]
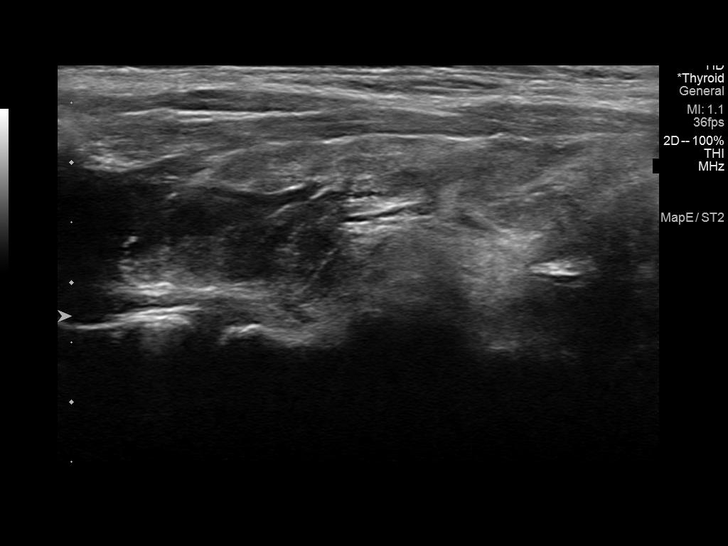
[im 23/43]
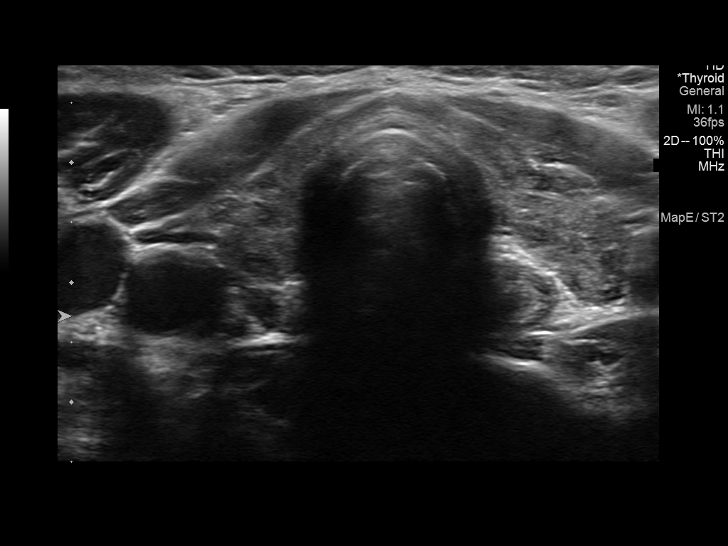
[im 27/43]
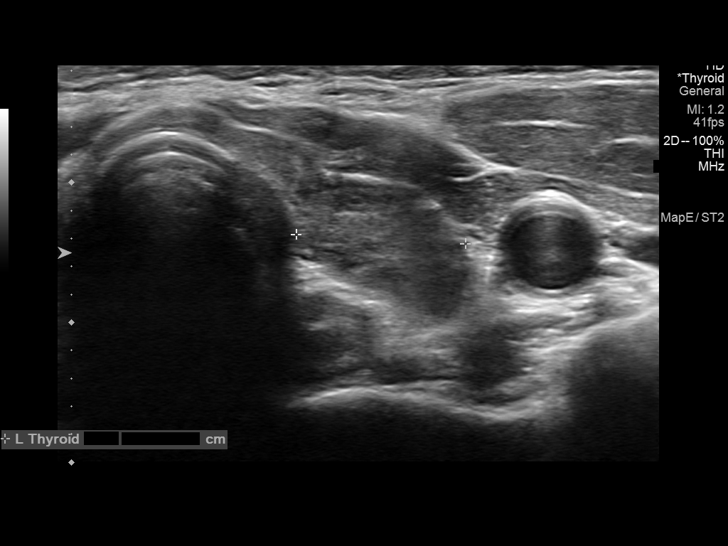
[im 29/43]
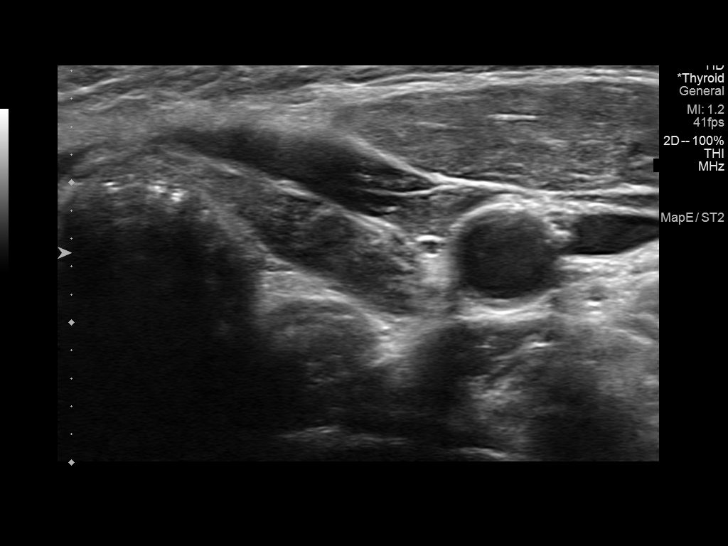
[im 32/43]
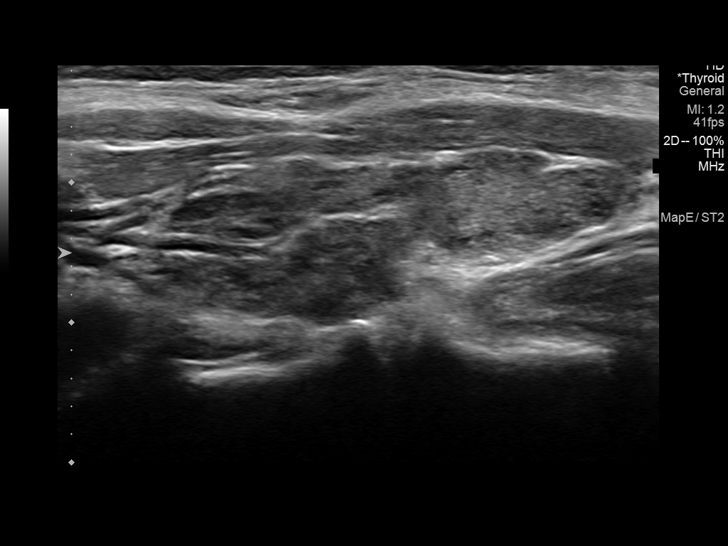
[im 36/43]
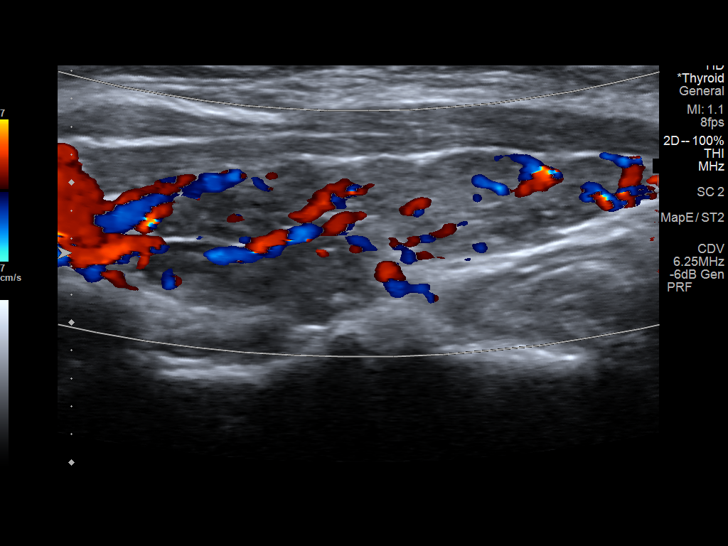
[im 39/43]
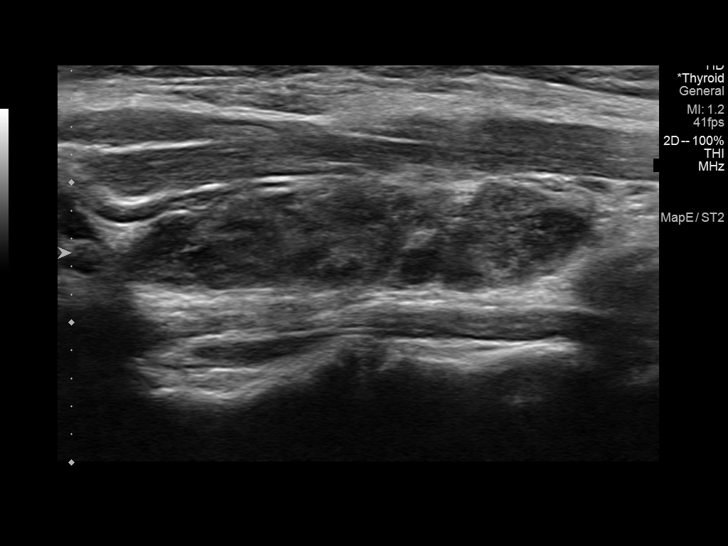
[im 43/43]
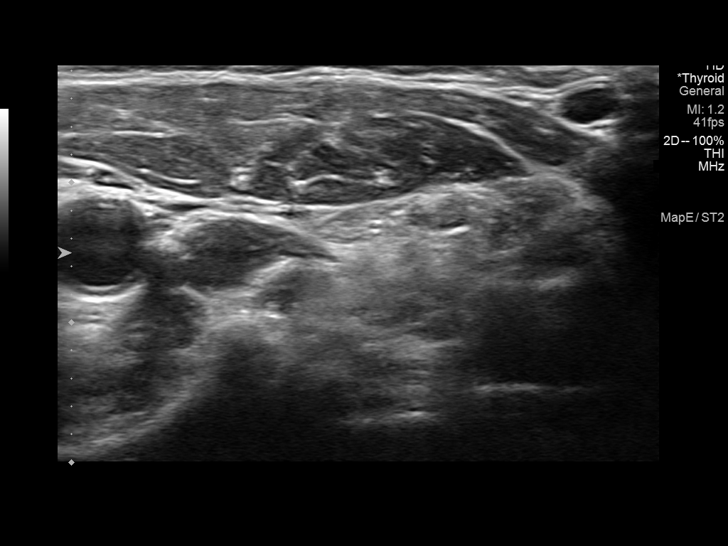

[14 of 25 positions shown; findings below may reference images not displayed]

FINDINGS: Parenchymal Echotexture: Moderately heterogenous

Isthmus: 0.2 cm

Right lobe: 4.6 x 1.0 x 1.1 cm

Left lobe: 3.8 x 1.1 x 1.2 cm

_________________________________________________________

Estimated total number of nodules >/= 1 cm: 0

Number of spongiform nodules >/=  2 cm not described below (TR1): 0

Number of mixed cystic and solid nodules >/= 1.5 cm not described
below (TR2): 6 0

_________________________________________________________

Thyroid tissue is diffusely heterogeneous and lobular but no
distinct nodules.
IMPRESSION: Thyroid tissue is heterogeneous but no discrete nodules.

## 2022-01-16 ENCOUNTER — Telehealth: Payer: Self-pay | Admitting: *Deleted

## 2022-01-16 NOTE — Chronic Care Management (AMB) (Signed)
  Care Coordination   Note   01/16/2022 Name: Doris Schmidt MRN: [id number] DOB: [birth year]  Doris Schmidt is a 56 y.o. year old female who sees Creola Corn, MD for primary care. I reached out to Scarlett Presto by phone today to offer care coordination services.  [name] was given information about Care Coordination services today including:   The Care Coordination services include support from the care team which includes your Nurse Coordinator, Clinical Social Worker, or Pharmacist.  The Care Coordination team is here to help remove barriers to the health concerns and goals most important to you. Care Coordination services are voluntary, and the patient may decline or stop services at any time by request to their care team member.   Care Coordination Consent Status: Patient agreed to services and verbal consent obtained.   Follow up plan:  Telephone appointment with care coordination team member scheduled for:  01/23/22  Encounter Outcome:  Pt. Scheduled  [facility] Coordination Care Guide  Direct Dial: [phone]

## 2022-01-23 ENCOUNTER — Ambulatory Visit: Payer: Self-pay

## 2022-01-23 DIAGNOSIS — Z6832 Body mass index (BMI) 32.0-32.9, adult: Secondary | ICD-10-CM | POA: Diagnosis not present

## 2022-01-23 DIAGNOSIS — E6609 Other obesity due to excess calories: Secondary | ICD-10-CM | POA: Diagnosis not present

## 2022-01-23 DIAGNOSIS — E119 Type 2 diabetes mellitus without complications: Secondary | ICD-10-CM | POA: Diagnosis not present

## 2022-01-23 DIAGNOSIS — Z9884 Bariatric surgery status: Secondary | ICD-10-CM | POA: Diagnosis not present

## 2022-01-23 DIAGNOSIS — D508 Other iron deficiency anemias: Secondary | ICD-10-CM | POA: Diagnosis not present

## 2022-01-23 NOTE — Patient Instructions (Signed)
Visit Information  Thank you for taking time to visit with me today. Please don't hesitate to contact me if I can be of assistance to you.   Following are the goals we discussed today:   Goals Addressed             This Visit's Progress    COMPLETED: Care Coordination Activities       Care Coordination Interventions: SDoH screening performed - no acute resource challenges identified Determined the patient does not have concerns of medication costs and/or adherence at this time Education and benefit of advance care planning provided  Reviewed role of care coordination team - no follow up needed at this time Instructed the patient to contact her health care provider as needed        Please call the care guide team at [phone] if you need to schedule an appointment with our care coordination team  If you are experiencing a Mental Health or Behavioral Health Crisis or need someone to talk to, please call 1-800-273-TALK (toll free, 24 hour hotline)  Patient verbalizes understanding of instructions and care plan provided today and agrees to view in MyChart. Active MyChart status and patient understanding of how to access instructions and care plan via MyChart confirmed with patient.     No further follow up required: Please contact your primary care provider as needed   Bevelyn Ngo, BSW, CDP Social Worker, Certified Dementia Practitioner Care Coordination [phone]

## 2022-01-23 NOTE — Patient Outreach (Signed)
  Care Coordination   Initial Visit Note   01/23/2022 Name: Doris Schmidt MRN: [id number] DOB: [birth year]  Doris Schmidt is a 56 y.o. year old female who sees Creola Corn, MD for primary care. I spoke with  Scarlett Presto by phone today.  What matters to the patients health and wellness today?  Doing well, no concerns at this time    Goals Addressed             This Visit's Progress    COMPLETED: Care Coordination Activities       Care Coordination Interventions: SDoH screening performed - no acute resource challenges identified Determined the patient does not have concerns of medication costs and/or adherence at this time Education and benefit of advance care planning provided  Reviewed role of care coordination team - no follow up needed at this time Instructed the patient to contact her health care provider as needed        SDOH assessments and interventions completed:  Yes  SDOH Interventions Today    Flowsheet Row Most Recent Value  SDOH Interventions   Food Insecurity Interventions Intervention Not Indicated  Housing Interventions Intervention Not Indicated  Transportation Interventions Intervention Not Indicated        Care Coordination Interventions Activated:  Yes  Care Coordination Interventions:  Yes, provided   Follow up plan: No further intervention required.   Encounter Outcome:  Pt. Visit Completed   Bevelyn Ngo, BSW, CDP Social Worker, Certified Dementia Practitioner Care Coordination [phone]

## 2022-01-23 NOTE — Patient Outreach (Signed)
  Care Coordination   01/23/2022 Name: Doris Schmidt MRN: [id number] DOB: 1966/04/08   Care Coordination Outreach Attempts:  An unsuccessful telephone outreach was attempted today to offer the patient information about available care coordination services as a benefit of their health plan.   Follow Up Plan:  Additional outreach attempts will be made to offer the patient care coordination information and services.   Encounter Outcome:  No Answer  Care Coordination Interventions Activated:  No   Care Coordination Interventions:  No, not indicated    Bevelyn Ngo, BSW, CDP Social Worker, Certified Dementia Practitioner Care Coordination [phone]

## 2022-01-28 DIAGNOSIS — E669 Obesity, unspecified: Secondary | ICD-10-CM | POA: Diagnosis not present

## 2022-01-28 DIAGNOSIS — Z6832 Body mass index (BMI) 32.0-32.9, adult: Secondary | ICD-10-CM | POA: Diagnosis not present

## 2022-01-28 DIAGNOSIS — E119 Type 2 diabetes mellitus without complications: Secondary | ICD-10-CM | POA: Diagnosis not present

## 2022-01-28 DIAGNOSIS — Z9884 Bariatric surgery status: Secondary | ICD-10-CM | POA: Diagnosis not present

## 2022-02-19 DIAGNOSIS — E119 Type 2 diabetes mellitus without complications: Secondary | ICD-10-CM | POA: Diagnosis not present

## 2022-02-22 DIAGNOSIS — E119 Type 2 diabetes mellitus without complications: Secondary | ICD-10-CM | POA: Diagnosis not present

## 2022-03-25 DIAGNOSIS — E119 Type 2 diabetes mellitus without complications: Secondary | ICD-10-CM | POA: Diagnosis not present

## 2022-04-24 DIAGNOSIS — E119 Type 2 diabetes mellitus without complications: Secondary | ICD-10-CM | POA: Diagnosis not present

## 2022-05-25 DIAGNOSIS — E119 Type 2 diabetes mellitus without complications: Secondary | ICD-10-CM | POA: Diagnosis not present

## 2022-05-28 DIAGNOSIS — E119 Type 2 diabetes mellitus without complications: Secondary | ICD-10-CM | POA: Diagnosis not present

## 2022-05-28 DIAGNOSIS — Z8639 Personal history of other endocrine, nutritional and metabolic disease: Secondary | ICD-10-CM | POA: Diagnosis not present

## 2022-05-28 DIAGNOSIS — E663 Overweight: Secondary | ICD-10-CM | POA: Diagnosis not present

## 2022-05-28 DIAGNOSIS — Z9884 Bariatric surgery status: Secondary | ICD-10-CM | POA: Diagnosis not present

## 2022-05-29 DIAGNOSIS — Z9884 Bariatric surgery status: Secondary | ICD-10-CM | POA: Diagnosis not present

## 2022-05-29 DIAGNOSIS — E039 Hypothyroidism, unspecified: Secondary | ICD-10-CM | POA: Diagnosis not present

## 2022-05-29 DIAGNOSIS — K912 Postsurgical malabsorption, not elsewhere classified: Secondary | ICD-10-CM | POA: Diagnosis not present

## 2022-05-29 DIAGNOSIS — F341 Dysthymic disorder: Secondary | ICD-10-CM | POA: Diagnosis not present

## 2022-05-29 DIAGNOSIS — E119 Type 2 diabetes mellitus without complications: Secondary | ICD-10-CM | POA: Diagnosis not present

## 2022-05-29 DIAGNOSIS — D508 Other iron deficiency anemias: Secondary | ICD-10-CM | POA: Diagnosis not present

## 2022-05-29 DIAGNOSIS — Z903 Acquired absence of stomach [part of]: Secondary | ICD-10-CM | POA: Diagnosis not present

## 2022-06-25 DIAGNOSIS — E119 Type 2 diabetes mellitus without complications: Secondary | ICD-10-CM | POA: Diagnosis not present

## 2022-07-14 DIAGNOSIS — Z01419 Encounter for gynecological examination (general) (routine) without abnormal findings: Secondary | ICD-10-CM | POA: Diagnosis not present

## 2022-07-14 DIAGNOSIS — Z124 Encounter for screening for malignant neoplasm of cervix: Secondary | ICD-10-CM | POA: Diagnosis not present

## 2022-07-14 DIAGNOSIS — Z1231 Encounter for screening mammogram for malignant neoplasm of breast: Secondary | ICD-10-CM | POA: Diagnosis not present

## 2022-07-14 DIAGNOSIS — Z6829 Body mass index (BMI) 29.0-29.9, adult: Secondary | ICD-10-CM | POA: Diagnosis not present

## 2022-07-16 DIAGNOSIS — D649 Anemia, unspecified: Secondary | ICD-10-CM | POA: Diagnosis not present

## 2022-07-16 DIAGNOSIS — I10 Essential (primary) hypertension: Secondary | ICD-10-CM | POA: Diagnosis not present

## 2022-07-16 DIAGNOSIS — K219 Gastro-esophageal reflux disease without esophagitis: Secondary | ICD-10-CM | POA: Diagnosis not present

## 2022-07-16 DIAGNOSIS — E039 Hypothyroidism, unspecified: Secondary | ICD-10-CM | POA: Diagnosis not present

## 2022-07-16 DIAGNOSIS — E1165 Type 2 diabetes mellitus with hyperglycemia: Secondary | ICD-10-CM | POA: Diagnosis not present

## 2022-07-16 DIAGNOSIS — E785 Hyperlipidemia, unspecified: Secondary | ICD-10-CM | POA: Diagnosis not present

## 2022-07-23 DIAGNOSIS — Z794 Long term (current) use of insulin: Secondary | ICD-10-CM | POA: Diagnosis not present

## 2022-07-23 DIAGNOSIS — R82998 Other abnormal findings in urine: Secondary | ICD-10-CM | POA: Diagnosis not present

## 2022-07-23 DIAGNOSIS — I10 Essential (primary) hypertension: Secondary | ICD-10-CM | POA: Diagnosis not present

## 2022-07-23 DIAGNOSIS — Z0000 Encounter for general adult medical examination without abnormal findings: Secondary | ICD-10-CM | POA: Diagnosis not present

## 2022-07-23 DIAGNOSIS — E1165 Type 2 diabetes mellitus with hyperglycemia: Secondary | ICD-10-CM | POA: Diagnosis not present

## 2022-07-23 DIAGNOSIS — E119 Type 2 diabetes mellitus without complications: Secondary | ICD-10-CM | POA: Diagnosis not present

## 2022-07-23 DIAGNOSIS — Z1339 Encounter for screening examination for other mental health and behavioral disorders: Secondary | ICD-10-CM | POA: Diagnosis not present

## 2022-07-23 DIAGNOSIS — Z1331 Encounter for screening for depression: Secondary | ICD-10-CM | POA: Diagnosis not present

## 2022-07-23 DIAGNOSIS — E785 Hyperlipidemia, unspecified: Secondary | ICD-10-CM | POA: Diagnosis not present

## 2022-07-23 DIAGNOSIS — Z23 Encounter for immunization: Secondary | ICD-10-CM | POA: Diagnosis not present

## 2022-07-24 DIAGNOSIS — E119 Type 2 diabetes mellitus without complications: Secondary | ICD-10-CM | POA: Diagnosis not present

## 2022-08-03 DIAGNOSIS — K912 Postsurgical malabsorption, not elsewhere classified: Secondary | ICD-10-CM | POA: Diagnosis not present

## 2022-08-03 DIAGNOSIS — Z9884 Bariatric surgery status: Secondary | ICD-10-CM | POA: Diagnosis not present

## 2022-08-17 DIAGNOSIS — L304 Erythema intertrigo: Secondary | ICD-10-CM | POA: Diagnosis not present

## 2022-08-17 DIAGNOSIS — Z9884 Bariatric surgery status: Secondary | ICD-10-CM | POA: Diagnosis not present

## 2022-08-17 DIAGNOSIS — K912 Postsurgical malabsorption, not elsewhere classified: Secondary | ICD-10-CM | POA: Diagnosis not present

## 2022-08-17 DIAGNOSIS — E119 Type 2 diabetes mellitus without complications: Secondary | ICD-10-CM | POA: Diagnosis not present

## 2022-08-24 DIAGNOSIS — E119 Type 2 diabetes mellitus without complications: Secondary | ICD-10-CM | POA: Diagnosis not present

## 2022-09-23 DIAGNOSIS — E119 Type 2 diabetes mellitus without complications: Secondary | ICD-10-CM | POA: Diagnosis not present

## 2022-10-24 DIAGNOSIS — E119 Type 2 diabetes mellitus without complications: Secondary | ICD-10-CM | POA: Diagnosis not present

## 2022-11-20 DIAGNOSIS — Z9884 Bariatric surgery status: Secondary | ICD-10-CM | POA: Diagnosis not present

## 2022-11-20 DIAGNOSIS — E119 Type 2 diabetes mellitus without complications: Secondary | ICD-10-CM | POA: Diagnosis not present

## 2022-11-20 DIAGNOSIS — Z6828 Body mass index (BMI) 28.0-28.9, adult: Secondary | ICD-10-CM | POA: Diagnosis not present

## 2022-11-20 DIAGNOSIS — E663 Overweight: Secondary | ICD-10-CM | POA: Diagnosis not present

## 2022-11-23 DIAGNOSIS — E119 Type 2 diabetes mellitus without complications: Secondary | ICD-10-CM | POA: Diagnosis not present

## 2022-11-25 DIAGNOSIS — Z903 Acquired absence of stomach [part of]: Secondary | ICD-10-CM | POA: Diagnosis not present

## 2022-11-25 DIAGNOSIS — E119 Type 2 diabetes mellitus without complications: Secondary | ICD-10-CM | POA: Diagnosis not present

## 2022-11-25 DIAGNOSIS — Z9884 Bariatric surgery status: Secondary | ICD-10-CM | POA: Diagnosis not present

## 2022-11-25 DIAGNOSIS — D508 Other iron deficiency anemias: Secondary | ICD-10-CM | POA: Diagnosis not present

## 2022-11-25 DIAGNOSIS — K912 Postsurgical malabsorption, not elsewhere classified: Secondary | ICD-10-CM | POA: Diagnosis not present

## 2022-12-24 DIAGNOSIS — E119 Type 2 diabetes mellitus without complications: Secondary | ICD-10-CM | POA: Diagnosis not present

## 2022-12-28 DIAGNOSIS — Z13820 Encounter for screening for osteoporosis: Secondary | ICD-10-CM | POA: Diagnosis not present

## 2023-01-24 DIAGNOSIS — E119 Type 2 diabetes mellitus without complications: Secondary | ICD-10-CM | POA: Diagnosis not present

## 2023-01-28 DIAGNOSIS — E039 Hypothyroidism, unspecified: Secondary | ICD-10-CM | POA: Diagnosis not present

## 2023-01-28 DIAGNOSIS — Z23 Encounter for immunization: Secondary | ICD-10-CM | POA: Diagnosis not present

## 2023-01-28 DIAGNOSIS — E119 Type 2 diabetes mellitus without complications: Secondary | ICD-10-CM | POA: Diagnosis not present

## 2023-02-19 DIAGNOSIS — E119 Type 2 diabetes mellitus without complications: Secondary | ICD-10-CM | POA: Diagnosis not present

## 2023-02-19 DIAGNOSIS — Z9884 Bariatric surgery status: Secondary | ICD-10-CM | POA: Diagnosis not present

## 2023-02-19 DIAGNOSIS — L304 Erythema intertrigo: Secondary | ICD-10-CM | POA: Diagnosis not present

## 2023-02-19 DIAGNOSIS — K912 Postsurgical malabsorption, not elsewhere classified: Secondary | ICD-10-CM | POA: Diagnosis not present

## 2023-03-05 DIAGNOSIS — Z903 Acquired absence of stomach [part of]: Secondary | ICD-10-CM | POA: Diagnosis not present

## 2023-03-05 DIAGNOSIS — K912 Postsurgical malabsorption, not elsewhere classified: Secondary | ICD-10-CM | POA: Diagnosis not present

## 2023-03-26 DIAGNOSIS — E119 Type 2 diabetes mellitus without complications: Secondary | ICD-10-CM | POA: Diagnosis not present

## 2023-04-25 DIAGNOSIS — E119 Type 2 diabetes mellitus without complications: Secondary | ICD-10-CM | POA: Diagnosis not present

## 2023-06-11 DIAGNOSIS — Z903 Acquired absence of stomach [part of]: Secondary | ICD-10-CM | POA: Diagnosis not present

## 2023-06-11 DIAGNOSIS — Z9884 Bariatric surgery status: Secondary | ICD-10-CM | POA: Diagnosis not present

## 2023-06-11 DIAGNOSIS — K912 Postsurgical malabsorption, not elsewhere classified: Secondary | ICD-10-CM | POA: Diagnosis not present

## 2023-07-03 DIAGNOSIS — J069 Acute upper respiratory infection, unspecified: Secondary | ICD-10-CM | POA: Diagnosis not present

## 2023-07-21 DIAGNOSIS — E039 Hypothyroidism, unspecified: Secondary | ICD-10-CM | POA: Diagnosis not present

## 2023-07-21 DIAGNOSIS — D649 Anemia, unspecified: Secondary | ICD-10-CM | POA: Diagnosis not present

## 2023-07-21 DIAGNOSIS — E1165 Type 2 diabetes mellitus with hyperglycemia: Secondary | ICD-10-CM | POA: Diagnosis not present

## 2023-07-21 DIAGNOSIS — Z1212 Encounter for screening for malignant neoplasm of rectum: Secondary | ICD-10-CM | POA: Diagnosis not present

## 2023-07-26 DIAGNOSIS — E119 Type 2 diabetes mellitus without complications: Secondary | ICD-10-CM | POA: Diagnosis not present

## 2023-07-26 DIAGNOSIS — Z0000 Encounter for general adult medical examination without abnormal findings: Secondary | ICD-10-CM | POA: Diagnosis not present

## 2023-07-26 DIAGNOSIS — Z1331 Encounter for screening for depression: Secondary | ICD-10-CM | POA: Diagnosis not present

## 2023-07-26 DIAGNOSIS — E1165 Type 2 diabetes mellitus with hyperglycemia: Secondary | ICD-10-CM | POA: Diagnosis not present

## 2023-07-26 DIAGNOSIS — Z1339 Encounter for screening examination for other mental health and behavioral disorders: Secondary | ICD-10-CM | POA: Diagnosis not present

## 2023-07-26 DIAGNOSIS — R82998 Other abnormal findings in urine: Secondary | ICD-10-CM | POA: Diagnosis not present

## 2023-07-26 DIAGNOSIS — I10 Essential (primary) hypertension: Secondary | ICD-10-CM | POA: Diagnosis not present

## 2023-08-06 DIAGNOSIS — E039 Hypothyroidism, unspecified: Secondary | ICD-10-CM | POA: Diagnosis not present

## 2023-08-06 DIAGNOSIS — D509 Iron deficiency anemia, unspecified: Secondary | ICD-10-CM | POA: Diagnosis not present

## 2023-08-06 DIAGNOSIS — E785 Hyperlipidemia, unspecified: Secondary | ICD-10-CM | POA: Diagnosis not present

## 2023-08-06 DIAGNOSIS — K219 Gastro-esophageal reflux disease without esophagitis: Secondary | ICD-10-CM | POA: Diagnosis not present

## 2023-08-06 DIAGNOSIS — K912 Postsurgical malabsorption, not elsewhere classified: Secondary | ICD-10-CM | POA: Diagnosis not present

## 2023-08-06 DIAGNOSIS — R079 Chest pain, unspecified: Secondary | ICD-10-CM | POA: Diagnosis not present

## 2023-08-06 DIAGNOSIS — Z9884 Bariatric surgery status: Secondary | ICD-10-CM | POA: Diagnosis not present

## 2023-08-06 DIAGNOSIS — E119 Type 2 diabetes mellitus without complications: Secondary | ICD-10-CM | POA: Diagnosis not present

## 2023-08-30 DIAGNOSIS — E65 Localized adiposity: Secondary | ICD-10-CM | POA: Diagnosis not present

## 2023-08-31 DIAGNOSIS — E785 Hyperlipidemia, unspecified: Secondary | ICD-10-CM | POA: Diagnosis not present

## 2023-08-31 DIAGNOSIS — Z9884 Bariatric surgery status: Secondary | ICD-10-CM | POA: Diagnosis not present

## 2023-08-31 DIAGNOSIS — K912 Postsurgical malabsorption, not elsewhere classified: Secondary | ICD-10-CM | POA: Diagnosis not present

## 2023-08-31 DIAGNOSIS — E119 Type 2 diabetes mellitus without complications: Secondary | ICD-10-CM | POA: Diagnosis not present

## 2023-12-21 DIAGNOSIS — L03811 Cellulitis of head [any part, except face]: Secondary | ICD-10-CM | POA: Diagnosis not present

## 2023-12-21 DIAGNOSIS — L299 Pruritus, unspecified: Secondary | ICD-10-CM | POA: Diagnosis not present

## 2023-12-28 DIAGNOSIS — Z01419 Encounter for gynecological examination (general) (routine) without abnormal findings: Secondary | ICD-10-CM | POA: Diagnosis not present

## 2023-12-28 DIAGNOSIS — Z6827 Body mass index (BMI) 27.0-27.9, adult: Secondary | ICD-10-CM | POA: Diagnosis not present

## 2024-01-17 DIAGNOSIS — Z1231 Encounter for screening mammogram for malignant neoplasm of breast: Secondary | ICD-10-CM | POA: Diagnosis not present

## 2024-01-20 DIAGNOSIS — E119 Type 2 diabetes mellitus without complications: Secondary | ICD-10-CM | POA: Diagnosis not present

## 2024-01-25 DIAGNOSIS — E119 Type 2 diabetes mellitus without complications: Secondary | ICD-10-CM | POA: Diagnosis not present

## 2024-01-25 DIAGNOSIS — Z23 Encounter for immunization: Secondary | ICD-10-CM | POA: Diagnosis not present
